# Patient Record
Sex: Male | Born: 1970 | Race: Black or African American | Hispanic: No | Marital: Married | Smoking: Never smoker
Health system: Southern US, Community
[De-identification: ages and names within clinical notes are randomized; demographics above are authoritative.]

## PROBLEM LIST (undated history)

## (undated) DIAGNOSIS — I10 Essential (primary) hypertension: Secondary | ICD-10-CM

## (undated) DIAGNOSIS — R51 Headache: Secondary | ICD-10-CM

## (undated) HISTORY — PX: NO PAST SURGERIES: SHX2092

---

## 2007-02-08 ENCOUNTER — Emergency Department (HOSPITAL_COMMUNITY): Admission: EM | Admit: 2007-02-08 | Discharge: 2007-02-08 | Payer: Self-pay | Admitting: Emergency Medicine

## 2007-02-13 ENCOUNTER — Encounter: Admission: RE | Admit: 2007-02-13 | Discharge: 2007-02-13 | Payer: Self-pay | Admitting: Internal Medicine

## 2008-07-10 ENCOUNTER — Emergency Department (HOSPITAL_COMMUNITY): Admission: EM | Admit: 2008-07-10 | Discharge: 2008-07-10 | Payer: Self-pay | Admitting: Family Medicine

## 2009-05-23 ENCOUNTER — Emergency Department (HOSPITAL_COMMUNITY): Admission: EM | Admit: 2009-05-23 | Discharge: 2009-05-23 | Payer: Self-pay | Admitting: Family Medicine

## 2009-07-20 ENCOUNTER — Emergency Department (HOSPITAL_COMMUNITY): Admission: EM | Admit: 2009-07-20 | Discharge: 2009-07-20 | Payer: Self-pay | Admitting: Family Medicine

## 2011-03-17 LAB — POCT I-STAT CREATININE
Creatinine, Ser: 1.4
Operator id: 192351

## 2011-03-17 LAB — I-STAT 8, (EC8 V) (CONVERTED LAB)
Chloride: 105
Glucose, Bld: 130 — ABNORMAL HIGH
Potassium: 4.3
pH, Ven: 7.663

## 2011-03-17 LAB — POCT CARDIAC MARKERS: Operator id: 192351

## 2013-06-16 ENCOUNTER — Inpatient Hospital Stay (HOSPITAL_COMMUNITY): Payer: BC Managed Care – PPO

## 2013-06-16 ENCOUNTER — Encounter (HOSPITAL_COMMUNITY): Payer: Self-pay | Admitting: Family Medicine

## 2013-06-16 ENCOUNTER — Emergency Department (HOSPITAL_COMMUNITY): Payer: BC Managed Care – PPO

## 2013-06-16 ENCOUNTER — Observation Stay (HOSPITAL_COMMUNITY)
Admission: EM | Admit: 2013-06-16 | Discharge: 2013-06-17 | DRG: 313 | Disposition: A | Discharge status: Home / Self Care | Payer: BC Managed Care – PPO | Attending: Internal Medicine | Admitting: Internal Medicine

## 2013-06-16 DIAGNOSIS — G43109 Migraine with aura, not intractable, without status migrainosus: Secondary | ICD-10-CM | POA: Diagnosis present

## 2013-06-16 DIAGNOSIS — I10 Essential (primary) hypertension: Secondary | ICD-10-CM

## 2013-06-16 DIAGNOSIS — R209 Unspecified disturbances of skin sensation: Secondary | ICD-10-CM

## 2013-06-16 DIAGNOSIS — R51 Headache: Secondary | ICD-10-CM

## 2013-06-16 DIAGNOSIS — Z91199 Patient's noncompliance with other medical treatment and regimen due to unspecified reason: Secondary | ICD-10-CM

## 2013-06-16 DIAGNOSIS — E669 Obesity, unspecified: Secondary | ICD-10-CM

## 2013-06-16 DIAGNOSIS — Z6829 Body mass index (BMI) 29.0-29.9, adult: Secondary | ICD-10-CM

## 2013-06-16 DIAGNOSIS — R202 Paresthesia of skin: Secondary | ICD-10-CM

## 2013-06-16 DIAGNOSIS — R079 Chest pain, unspecified: Principal | ICD-10-CM | POA: Diagnosis present

## 2013-06-16 DIAGNOSIS — Z9119 Patient's noncompliance with other medical treatment and regimen: Secondary | ICD-10-CM

## 2013-06-16 DIAGNOSIS — R519 Headache, unspecified: Secondary | ICD-10-CM

## 2013-06-16 DIAGNOSIS — F410 Panic disorder [episodic paroxysmal anxiety] without agoraphobia: Secondary | ICD-10-CM

## 2013-06-16 HISTORY — DX: Headache: R51

## 2013-06-16 HISTORY — DX: Essential (primary) hypertension: I10

## 2013-06-16 LAB — URINALYSIS, ROUTINE W REFLEX MICROSCOPIC
Bilirubin Urine: NEGATIVE
GLUCOSE, UA: NEGATIVE mg/dL
HGB URINE DIPSTICK: NEGATIVE
Ketones, ur: 15 mg/dL — AB
LEUKOCYTES UA: NEGATIVE
Nitrite: NEGATIVE
PROTEIN: 30 mg/dL — AB
SPECIFIC GRAVITY, URINE: 1.019 (ref 1.005–1.030)
UROBILINOGEN UA: 0.2 mg/dL (ref 0.0–1.0)
pH: 6 (ref 5.0–8.0)

## 2013-06-16 LAB — CBC
HEMATOCRIT: 55.6 % — AB (ref 39.0–52.0)
Hemoglobin: 19.2 g/dL — ABNORMAL HIGH (ref 13.0–17.0)
MCH: 30 pg (ref 26.0–34.0)
MCHC: 34.5 g/dL (ref 30.0–36.0)
MCV: 86.7 fL (ref 78.0–100.0)
PLATELETS: 243 10*3/uL (ref 150–400)
RBC: 6.41 MIL/uL — ABNORMAL HIGH (ref 4.22–5.81)
RDW: 14.3 % (ref 11.5–15.5)
WBC: 9.2 10*3/uL (ref 4.0–10.5)

## 2013-06-16 LAB — COMPREHENSIVE METABOLIC PANEL
ALBUMIN: 4.6 g/dL (ref 3.5–5.2)
ALT: 26 U/L (ref 0–53)
AST: 24 U/L (ref 0–37)
Alkaline Phosphatase: 60 U/L (ref 39–117)
BILIRUBIN TOTAL: 0.6 mg/dL (ref 0.3–1.2)
BUN: 10 mg/dL (ref 6–23)
CALCIUM: 9.9 mg/dL (ref 8.4–10.5)
CO2: 21 mEq/L (ref 19–32)
CREATININE: 1.33 mg/dL (ref 0.50–1.35)
Chloride: 94 mEq/L — ABNORMAL LOW (ref 96–112)
GFR calc Af Amer: 75 mL/min — ABNORMAL LOW (ref >90)
GFR calc non Af Amer: 65 mL/min — ABNORMAL LOW (ref >90)
Glucose, Bld: 173 mg/dL — ABNORMAL HIGH (ref 70–99)
Potassium: 3.4 mEq/L — ABNORMAL LOW (ref 3.7–5.3)
Sodium: 138 mEq/L (ref 137–147)
Total Protein: 8.6 g/dL — ABNORMAL HIGH (ref 6.0–8.3)

## 2013-06-16 LAB — RAPID URINE DRUG SCREEN, HOSP PERFORMED
Amphetamines: NOT DETECTED
Barbiturates: NOT DETECTED
Benzodiazepines: NOT DETECTED
COCAINE: NOT DETECTED
OPIATES: POSITIVE — AB
TETRAHYDROCANNABINOL: NOT DETECTED

## 2013-06-16 LAB — GLUCOSE, CAPILLARY: Glucose-Capillary: 124 mg/dL — ABNORMAL HIGH (ref 70–99)

## 2013-06-16 LAB — URINE MICROSCOPIC-ADD ON

## 2013-06-16 LAB — TROPONIN I: Troponin I: <0.3 ng/mL (ref <0.30)

## 2013-06-16 LAB — POCT I-STAT TROPONIN I: TROPONIN I, POC: 0.01 ng/mL (ref 0.00–0.08)

## 2013-06-16 LAB — D-DIMER, QUANTITATIVE: D-Dimer, Quant: 0.3 ug/mL-FEU (ref 0.00–0.48)

## 2013-06-16 MED ORDER — ASPIRIN 325 MG PO TABS
325.0000 mg | ORAL_TABLET | Freq: Every day | ORAL | Status: DC
  Administered 2013-06-17: 325 mg via ORAL
  Filled 2013-06-16 (×2): qty 1

## 2013-06-16 MED ORDER — NITROGLYCERIN 0.4 MG SL SUBL
0.4000 mg | SUBLINGUAL_TABLET | SUBLINGUAL | Status: DC | PRN
Start: 2013-06-16 — End: 2013-06-16

## 2013-06-16 MED ORDER — SODIUM CHLORIDE 0.9 % IV BOLUS (SEPSIS)
1000.0000 mL | Freq: Once | INTRAVENOUS | Status: AC
  Administered 2013-06-16: 1000 mL via INTRAVENOUS

## 2013-06-16 MED ORDER — ASPIRIN 81 MG PO CHEW: 324.0000 mg | CHEWABLE_TABLET | Freq: Once | ORAL | Status: DC

## 2013-06-16 MED ORDER — MORPHINE SULFATE 4 MG/ML IJ SOLN
4.0000 mg | Freq: Once | INTRAMUSCULAR | Status: AC
  Administered 2013-06-16: 4 mg via INTRAVENOUS
  Filled 2013-06-16: qty 1

## 2013-06-16 MED ORDER — NITROGLYCERIN 0.4 MG SL SUBL
0.4000 mg | SUBLINGUAL_TABLET | SUBLINGUAL | Status: DC | PRN
  Administered 2013-06-16: 0.4 mg via SUBLINGUAL

## 2013-06-16 MED ORDER — DIPHENHYDRAMINE HCL 50 MG/ML IJ SOLN
12.5000 mg | Freq: Once | INTRAMUSCULAR | Status: AC
  Administered 2013-06-16: 12.5 mg via INTRAVENOUS
  Filled 2013-06-16: qty 1

## 2013-06-16 MED ORDER — ASPIRIN 325 MG PO TABS
325.0000 mg | ORAL_TABLET | ORAL | Status: AC
  Administered 2013-06-16: 325 mg via ORAL
  Filled 2013-06-16: qty 1

## 2013-06-16 MED ORDER — ACETAMINOPHEN 325 MG PO TABS
650.0000 mg | ORAL_TABLET | Freq: Once | ORAL | Status: AC
  Administered 2013-06-16: 650 mg via ORAL
  Filled 2013-06-16: qty 2

## 2013-06-16 MED ORDER — HEPARIN SODIUM (PORCINE) 5000 UNIT/ML IJ SOLN
5000.0000 [IU] | Freq: Three times a day (TID) | INTRAMUSCULAR | Status: DC
  Administered 2013-06-16 – 2013-06-17 (×2): 5000 [IU] via SUBCUTANEOUS
  Filled 2013-06-16 (×5): qty 1

## 2013-06-16 MED ORDER — KETOROLAC TROMETHAMINE 30 MG/ML IJ SOLN
30.0000 mg | Freq: Once | INTRAMUSCULAR | Status: AC
  Administered 2013-06-16: 30 mg via INTRAVENOUS
  Filled 2013-06-16: qty 1

## 2013-06-16 MED ORDER — MORPHINE SULFATE 4 MG/ML IJ SOLN
4.0000 mg | Freq: Once | INTRAMUSCULAR | Status: DC
Start: 2013-06-16 — End: 2013-06-17
  Filled 2013-06-16: qty 1

## 2013-06-16 MED ORDER — ISOMETHEPTENE-APAP-DICHLORAL 65-325-100 MG PO CAPS
2.0000 | ORAL_CAPSULE | Freq: Once | ORAL | Status: AC
  Administered 2013-06-16: 2 via ORAL
  Filled 2013-06-16: qty 2

## 2013-06-16 MED ORDER — PROCHLORPERAZINE EDISYLATE 5 MG/ML IJ SOLN
10.0000 mg | Freq: Four times a day (QID) | INTRAMUSCULAR | Status: DC | PRN
  Administered 2013-06-16: 10 mg via INTRAVENOUS
  Filled 2013-06-16: qty 2

## 2013-06-16 NOTE — H&P (Signed)
[facility] History and Physical  Trenden Hazelrigg ZOX:[id number] DOB: [birth year] DOA: 06/16/2013  Referring physician: Ward PCP: Katy Apo, MD  Specialists: Stroke service  Chief Complaint: multiple, chest pain, headache, vomiting  HPI: James Hernandez is a 43 y.o. male came to [facility] ed 06/16/2013 with  Onset this morning 70 in headache. He has been having headache for the past 5-6 days. It was initially mild but then became more acute this morning 10/10. He was driving over to his primary care physician's office Dr. Nehemiah Settle for his regular physical for the year and he felt at that point like his chest was tightening up, he had an episode of vomiting. He states that his body locked up and he can feel the lower half of his body or his right side or his right leg. He couldn't really tell anything differently but did not have any weakness on any one side but he did not have any loss of consciousness. He has no history of specific medical illnesses other than hypertension but he is not compliant on his home medications. He states that his headache is behind his eyes and radiates down to the back of his neck. Lites subjectively seems to make it worse in the ED - he states his headaches are better. He then complained of some chest pain which lasted about 5-10 minutes and sort of went away. He came to the emergency room and was given IV morphine, nitroglycerin which paradoxically made his headache worse and he had an EKG for his chest pain The EKG showed normal sinus rhythm PR interval 0.12 QRS axis 80 nonspecific ST segment elevation in V2 only with PVC inversions in V4 through 6, unclear chronicity.  Point-of-care troponin was done which was 0.01  rest of labs were negative  CT scan head performed =normal noncontrast CT brain   Review of Systems: The patient denies  Recent fever chills nausea vomiting weakness on any one side body or however now he has numbness on the right side of the face and  right side of the body No dark stool no tarry stool no other issues  No past medical history on file. No past surgical history on file. Social History:  History   Social History Narrative  . No narrative on file    No Known Allergies  No family history on file.  Noncontributory, father had hypertension Mother has no illnesses  Prior to Admission medications   Not on File   Physical Exam: Filed Vitals:   06/16/13 1424 06/16/13 1441 06/16/13 1530 06/16/13 1537  BP: 152/98 140/99 143/85 143/85  Pulse: 99 70 65 67  Temp: 99 F (37.2 C)   98.8 F (37.1 C)  TempSrc: Oral   Oral  Resp: 26 22 13 18   SpO2: 100% 98% 98% 96%     General:  EOMI, NCAT  Eyes: see above  ENT:  No JVD no bruit  Neck:  Soft supple  Cardiovascular:  S1-S2 no murmur rub or gallop  Respiratory:  Clinically clear  Abdomen:  Soft nontender nondistended no rebound or guarding  Skin:  No lower extremity edema  Musculoskeletal:  Range of motion intact  Psychiatric:  Euthymic   Neurologic:  of  Labs on Admission:  Basic Metabolic Panel:  Recent Labs Lab 06/16/13 1439  NA 138  K 3.4*  CL 94*  CO2 21  GLUCOSE 173*  BUN 10  CREATININE 1.33  CALCIUM 9.9   Liver Function Tests:  Recent Labs Lab 06/16/13 1439  AST 24  ALT 26  ALKPHOS 60  BILITOT 0.6  PROT 8.6*  ALBUMIN 4.6   No results found for this basename: LIPASE, AMYLASE,  in the last 168 hours No results found for this basename: AMMONIA,  in the last 168 hours CBC:  Recent Labs Lab 06/16/13 1431  WBC 9.2  HGB 19.2*  HCT 55.6*  MCV 86.7  PLT 243   Cardiac Enzymes:  Recent Labs Lab 06/16/13 1614  TROPONINI <0.30    BNP (last 3 results) No results found for this basename: PROBNP,  in the last 8760 hours CBG: No results found for this basename: GLUCAP,  in the last 168 hours  Radiological Exams on Admission: Dg Chest 2 View  06/16/2013   CLINICAL DATA:  Chest pain  EXAM: CHEST  2 VIEW  COMPARISON:   February 08, 2007  FINDINGS: There is an opacity either within or overlying the anterior left 1st rib measuring 1.1 x 1.4 cm. Elsewhere lungs are clear. Heart size and pulmonary vascularity are normal. No adenopathy.  IMPRESSION: Opacity either in or overlying the anterior left 1st rib. Given this circumstance, the would advise apical lordotic chest radiograph to further evaluate. If this area of opacity cannot be confirmed to reside within the anterior left 1st rib, correlation with noncontrast enhanced chest CT would be advised to further assess. Elsewhere lungs are clear.   Electronically Signed   By: Bretta Bang M.D.   On: 06/16/2013 15:09   Ct Head Wo Contrast  06/16/2013   CLINICAL DATA:  43 year old male with headache, chest pain, disoriented, difficulty speaking. Initial encounter.  EXAM: CT HEAD WITHOUT CONTRAST  TECHNIQUE: Contiguous axial images were obtained from the base of the skull through the vertex without intravenous contrast.  COMPARISON:  None.  FINDINGS: Minimal paranasal sinus mucosal thickening. Mastoids and tympanic cavities are clear. No acute osseous abnormality identified. Scattered mostly punctate scalp soft tissue calcifications, might in part be sequelae of remote trauma. Dysconjugate gaze, otherwise negative orbits soft tissues.  Cerebral volume is normal. No midline shift, ventriculomegaly, mass effect, evidence of mass lesion, intracranial hemorrhage or evidence of cortically based acute infarction. Gray-white matter differentiation is within normal limits throughout the brain. No suspicious intracranial vascular hyperdensity.  IMPRESSION: Normal noncontrast CT appearance of the brain.   Electronically Signed   By: Augusto Gamble M.D.   On: 06/16/2013 15:15    EKG: Independently reviewed. See Above  Assessment/Plan Principal Problem:   Chest pain- etiology, although EKG showed some T-wave inversions in some leads, point-of-care troponin is negative. He will need to  probably  Be risk stratified and I have spoken to Dr. Clifton James of cardiology who recommends to rule out acute coronary syndrome by troponins repeat EKG in the morning. If needed, please consult cardiology formally. Active Problems:   CVA (cerebral infarction)- Discussed with Dr. Roseanne Reno of neurology his impression. DDX = hemiplegic migraine, panic disorder causing both symptoms. He states that his chest pain felt like a sour feeling in his chest radiating up and down and then he also states in the same breath that he has pain radiating from the chest down his back down to his legs. This does not need any syndrome or sign and Dr. Roseanne Reno recommends MRI of the brain to definitively and objectively rule out an infarct. He will see the patient in consult.   4 atypical migraine treatment I have started him on Midrin. We will add Zanaflex or Flexeril as needed.   Hypertension- Monitor-  until  MRI is done, allow permissive hypertension   Panic anxiety syndrome-   Obesity- see above,  Outpatient followup   Time spent:  [address]  [address], [facility]AI[facility]URMUKH [facility] Pager [phone]  If 7PM-7AM, please contact night-coverage [email or web address] Password [email or web address] 06/16/2013, 4:59 PM

## 2013-06-16 NOTE — ED Notes (Signed)
Pt arrives via private vehicle from home. Needed staff assistance getting out of car. Pt nauseated, vomiting, near syncope with large amount of diaphoresis. States chest pain beginning at 0700. Pt states no cardiac hx. Unable to obtain full hx. Pt transferred to ready available room via wheelchair with staff.

## 2013-06-16 NOTE — ED Notes (Signed)
hospitalist at bedside

## 2013-06-16 NOTE — ED Notes (Signed)
Pt states cp has gone but headache is still there.

## 2013-06-16 NOTE — Consult Note (Signed)
Neurology Consultation Reason for Consult: tingling  Referring Physician: Landis Gandy  CC: Headache  History is obtained from: Patient  HPI: James Hernandez is a 43 y.o. male with a history of headaches worsening over the past few months who awoke this morning with a throbbing headache. He states that is retro-orbital, bilateral but worse on the right. Over the course of the morning he began noticing some paresthesias of his face bilaterally. Later in the morning, he had acute onset of chest pain that caused him to double over. In this. He fell he was able to do anything because of the pain. This resolved, however he was left with persistent numbness right greater than left as well as tingling. The tingling has slowly retracted marching up his leg and is now just confined to his right temple.   LKW: 1/11 prior to bedtime tpa given?: no, outside of window    ROS: A 14 point ROS was performed and is negative except as noted in the HPI.  PMH: Headaches  Family History: Mother-trigeminal neuralgia  Social History: Tob: Denies  Exam: Current vital signs: BP 125/76  Pulse 79  Temp(Src) 98.2 F (36.8 C) (Oral)  Resp 18  Ht 5\' 11"  (1.803 m)  Wt 97.387 kg (214 lb 11.2 oz)  BMI 29.96 kg/m2  SpO2 97% Vital signs in last 24 hours: Temp:  [98.2 F (36.8 C)-99 F (37.2 C)] 98.2 F (36.8 C) (01/12 1928) Pulse Rate:  [65-99] 79 (01/12 1928) Resp:  [13-26] 18 (01/12 1928) BP: (125-152)/(71-99) 125/76 mmHg (01/12 1928) SpO2:  [96 %-100 %] 97 % (01/12 1928) Weight:  [97.387 kg (214 lb 11.2 oz)] 97.387 kg (214 lb 11.2 oz) (01/12 1928)  General: In bed, NAD CV: Regular rate and rhythm Mental Status: Patient is awake, alert, oriented to person, place, month, year, and situation. Immediate and remote memory are intact. Patient is able to give a clear and coherent history. No signs of aphasia or neglect Cranial Nerves: II: Visual Fields are full. Pupils are equal, round, and reactive to  light.  Discs are difficult to visualize. III,IV, VI: EOMI without ptosis or diploplia.  V: Facial sensation is symmetric to temperature VII: Facial movement is symmetric.  VIII: hearing is intact to voice X: Uvula elevates symmetrically XI: Shoulder shrug is symmetric. XII: tongue is midline without atrophy or fasciculations.  Motor: Tone is normal. Bulk is normal. 5/5 strength was present in all four extremities.  Sensory: Sensation is symmetric to light touch and temperature in the arms and legs. Deep Tendon Reflexes: 2+ and symmetric in the biceps and patellae.  Plantars: Toes are downgoing bilaterally.  Cerebellar: FNF and HKS are intact bilaterally Gait: Patient has a stable casual gait.          I have reviewed labs in epic and the results pertinent to this consultation are: CMP-elevated glucose  I have reviewed the images obtained: CT head-negative  Impression: 43 year old male with throbbing headache associated with photophobia and paresthesias, bilateral but right more than left. I suspect that this represents complicated migraine. This is not typically associated with chest pain, and will defer to internal medicine for the evaluation of this symptom.  He will need an MRI to rule out a small stroke, but my index of suspicion for this is low  Recommendations: 1) MRI brain-if positive will need stroke Workup 2) Compazine/Benadryl/Toradol for likely complicated migraine 3) ASA reasonable for now, if no stroke on MRI then my suspicion for vascular event involving the  CNS is low, would defer to internal medicine regarding need for cardiac reasons. 4) will continue to follow    Ritta SlotMcNeill Zolton Dowson, MD [facility] [phone]  If 7pm- 7am, please page neurology on call at [phone].

## 2013-06-16 NOTE — ED Provider Notes (Signed)
TIME SEEN: 2:50 PM  CHIEF COMPLAINT: Headache, chest pain, diaphoresis, vomiting  HPI: Patient is a 43 year old male with history of chronic headaches, hypertension, prior tobacco use who presents the emergency department with diffuse chest tightness that started at 1 PM today while at rest with associated shortness of breath, nausea and vomiting, diaphoresis. Patient reports that he also had a gradual onset, diffuse, throbbing headache that started this morning. He's had similar headaches in the past. He states he feels very weak all over but no focal weakness or numbness. No head injury, anticoagulation use, thunderclap headache. No fever, neck pain or neck stiffness. Denies a prior history of cardiac disease, stress test or stents.  ROS: See HPI Constitutional: no fever  Eyes: no drainage  ENT: no runny nose   Cardiovascular:  chest pain  Resp:  SOB  GI: no vomiting GU: no dysuria Integumentary: no rash  Allergy: no hives  Musculoskeletal: no leg swelling  Neurological: no slurred speech ROS otherwise negative  PAST MEDICAL HISTORY/PAST SURGICAL HISTORY:  No past medical history on file.  MEDICATIONS:  Prior to Admission medications   Not on File    ALLERGIES:  No Known Allergies  SOCIAL HISTORY:  History  Substance Use Topics  . Smoking status: Not on file  . Smokeless tobacco: Not on file  . Alcohol Use: Not on file    FAMILY HISTORY: No family history on file.  EXAM: BP 140/99  Pulse 70  Temp(Src) 99 F (37.2 C) (Oral)  Resp 22  SpO2 98% CONSTITUTIONAL: Alert and oriented and responds appropriately to questions. Well-appearing; well-nourished HEAD: Normocephalic EYES: Conjunctivae clear, PERRL; patient will not open his eyes when I am speaking to him that he states he can't, when he does open his eyes he rolls his eyes in the back of his head and states "I cannot see" about when I open his eyelids his pupils are equal reactive bilaterally and he reports normal  vision ENT: normal nose; no rhinorrhea; moist mucous membranes; pharynx without lesions noted NECK: Supple, no meningismus, no LAD  CARD: RRR; S1 and S2 appreciated; no murmurs, no clicks, no rubs, no gallops RESP: Normal chest excursion without splinting or tachypnea; breath sounds clear and equal bilaterally; no wheezes, no rhonchi, no rales,  ABD/GI: Normal bowel sounds; non-distended; soft, non-tender, no rebound, no guarding BACK:  The back appears normal and is non-tender to palpation, there is no CVA tenderness EXT: Normal ROM in all joints; non-tender to palpation; no edema; normal capillary refill; no cyanosis    SKIN: Normal color for age and race; warm; extremely diaphoretic NEURO: Moves all extremities equally but very minimally, he states he feels "too weak to move"; cranial nerves II through XII intact, sensation to light touch intact diffusely PSYCH: The patient's mood and manner are appropriate. Grooming and personal hygiene are appropriate.  MEDICAL DECISION MAKING: Patient here with gradual onset, diffuse headache that is similar to his prior chronic headaches and chest pain, shortness of breath, nausea vomiting, diaphoresis. He is minimal risk factors for ACS but appears very uncomfortable and diaphoretic on exam. He is hemodynamically stable. EKG shows T-wave inversion in inferior and lateral leads that is similar compared to prior EKG. There is no new ischemic changes. He has no focal neurologic deficit on his exam. Will obtain head CT, labs including troponin and d-dimer, chest x-ray. We'll give morphine for pain.  ED PROGRESS: Patient reports he is feeling better but his chest is completely resolved after morphine. His  labs are relatively unremarkable the patient has mild hypokalemia, will replace. Troponin negative. Chest x-ray shows a possible opacity over the anterior left first rib. Radiology recommended a noncontrast chest CT which I feel can be done as an outpatient. His  head CT is negative. D-dimer negative. We'll give nitroglycerin for his chest pain. He is now no longer diaphoretic and is moving all his extremities normally and opening his eyes. Will discuss with hospitalist for admission for ACS rule out. His primary care physician is Dr. Nehemiah Settle with [facility] physicians.   4:02 PM  Spoke with Dr. Mahala Menghini with hospitalist service for admission ACS rule out. Patient is hemodynamically stable. Repeat EKG shows T-wave inversions in inferior and lateral leads that have been present in prior EKGs. Will repeat troponin per hospitalist request.  Patient's chest pain is almost completely resolved after morphine and nitroglycerin. He is now complaining of worsening headache. Will repeat morphine and give IV fluids.     EKG Interpretation    Date/Time:  Monday June 16 2013 14:22:30 EST Ventricular Rate:  103 PR Interval:  152 QRS Duration: 90 QT Interval:  334 QTC Calculation: 437 R Axis:   99 Text Interpretation:  Sinus tachycardia with Possible Premature atrial complexes with Abberant conduction Biatrial enlargement Left ventricular hypertrophy Lateral infarct , age undetermined T wave abnormality, consider inferior ischemia Abnormal ECG No significant change since last tracing Confirmed by Nayelli Inglis  DO, Totiana Everson [phone]) on 06/16/2013 2:29:43 PM           EKG Interpretation    Date/Time:  Monday June 16 2013 15:55:17 EST Ventricular Rate:  66 PR Interval:  152 QRS Duration: 82 QT Interval:  403 QTC Calculation: 422 R Axis:   82 Text Interpretation:  Age not entered, assumed to be  43 years old for purpose of ECG interpretation Sinus rhythm Nonspecific T abnrm, anterolateral leads No significant change since 02/08/2007 Confirmed by Cymone Yeske  DO, Solaris Kram ([id number]) on 06/16/2013 4:04:59 PM             Layla Maw Duncan Alejandro, DO 06/16/13 1605

## 2013-06-16 NOTE — ED Notes (Signed)
Per pt's son pt was in the car on the way to a dr appt when he began to breath heavily, vomit, and complained of chest pain and told son to drive him here. Pt was diaphoretic, pale, and c/o cp and rt sided leg pain. Pt rated pain 8/10. Pt needed assistance from wheelchair to bed.

## 2013-06-16 NOTE — ED Notes (Signed)
Pt sts had a 10/10 headache this morning and persisted, behind temple and radiated to back of neck. Pt son was driving to PCP had chest pains and 1 episode of vomiting. Asked son to drive to to ED instead. Pt sts pain is now 4/10 behind right eye. And unratable- sts "mild" discomfort in chest.

## 2013-06-17 LAB — LIPID PANEL
CHOL/HDL RATIO: 4.8 ratio
CHOLESTEROL: 218 mg/dL — AB (ref 0–200)
HDL: 45 mg/dL (ref >39)
LDL Cholesterol: 154 mg/dL — ABNORMAL HIGH (ref 0–99)
TRIGLYCERIDES: 95 mg/dL (ref <150)
VLDL: 19 mg/dL (ref 0–40)

## 2013-06-17 LAB — GLUCOSE, CAPILLARY
GLUCOSE-CAPILLARY: 130 mg/dL — AB (ref 70–99)
GLUCOSE-CAPILLARY: 97 mg/dL (ref 70–99)

## 2013-06-17 MED ORDER — LISINOPRIL-HYDROCHLOROTHIAZIDE 10-12.5 MG PO TABS: 1.0000 | ORAL_TABLET | Freq: Every day | ORAL | Status: DC

## 2013-06-17 NOTE — Progress Notes (Addendum)
Subjective: Currently patient asymptomatic except for a mild headache. Admission H&P reviewed all studies reviewed. Patient presented with a myriad symptoms. He's been seen in consultation by neurology,MRI without any acute CVA. Cardiology was curb sided, troponin normal, d-dimer normal, EKG without acute changes. Currently fairly asymptomatic.  Objective: Vital signs in last 24 hours: Temp:  [97.6 F (36.4 C)-99 F (37.2 C)] 98 F (36.7 C) (01/13 0800) Pulse Rate:  [65-99] 66 (01/13 0800) Resp:  [13-26] 18 (01/13 0800) BP: (125-152)/(67-99) 144/67 mmHg (01/13 0800) SpO2:  [96 %-100 %] 98 % (01/13 0800) Weight:  [97.387 kg (214 lb 11.2 oz)] 97.387 kg (214 lb 11.2 oz) (01/12 1928) Weight change:  Last BM Date: 06/16/13  Intake/Output from previous day:   Intake/Output this shift:    General appearance: alert and cooperative Resp: clear to auscultation bilaterally Cardio: regular rate and rhythm, S1, S2 normal, no murmur, click, rub or gallop Extremities: extremities normal, atraumatic, no cyanosis or edema Neurologic: Grossly normal  Lab Results:  Results for orders placed during the hospital encounter of 06/16/13 (from the past 24 hour(s))  D-DIMER, QUANTITATIVE     Status: None   Collection Time    06/16/13  2:00 PM      Result Value Range   D-Dimer, Quant 0.30  0.00 - 0.48 ug/mL-FEU  CBC     Status: Abnormal   Collection Time    06/16/13  2:31 PM      Result Value Range   WBC 9.2  4.0 - 10.5 K/uL   RBC 6.41 (*) 4.22 - 5.81 MIL/uL   Hemoglobin 19.2 (*) 13.0 - 17.0 g/dL   HCT 55.6 (*) 39.0 - 52.0 %   MCV 86.7  78.0 - 100.0 fL   MCH 30.0  26.0 - 34.0 pg   MCHC 34.5  30.0 - 36.0 g/dL   RDW 14.3  11.5 - 15.5 %   Platelets 243  150 - 400 K/uL  COMPREHENSIVE METABOLIC PANEL     Status: Abnormal   Collection Time    06/16/13  2:39 PM      Result Value Range   Sodium 138  137 - 147 mEq/L   Potassium 3.4 (*) 3.7 - 5.3 mEq/L   Chloride 94 (*) 96 - 112 mEq/L   CO2 21  19  - 32 mEq/L   Glucose, Bld 173 (*) 70 - 99 mg/dL   BUN 10  6 - 23 mg/dL   Creatinine, Ser 1.33  0.50 - 1.35 mg/dL   Calcium 9.9  8.4 - 10.5 mg/dL   Total Protein 8.6 (*) 6.0 - 8.3 g/dL   Albumin 4.6  3.5 - 5.2 g/dL   AST 24  0 - 37 U/L   ALT 26  0 - 53 U/L   Alkaline Phosphatase 60  39 - 117 U/L   Total Bilirubin 0.6  0.3 - 1.2 mg/dL   GFR calc non Af Amer 65 (*) >90 mL/min   GFR calc Af Amer 75 (*) >90 mL/min  POCT I-STAT TROPONIN I     Status: None   Collection Time    06/16/13  2:50 PM      Result Value Range   Troponin i, poc 0.01  0.00 - 0.08 ng/mL   Comment 3           TROPONIN I     Status: None   Collection Time    06/16/13  4:14 PM      Result Value Range  Troponin I <0.30  <0.30 ng/mL  URINALYSIS, ROUTINE W REFLEX MICROSCOPIC     Status: Abnormal   Collection Time    06/16/13  6:21 PM      Result Value Range   Color, Urine YELLOW  YELLOW   APPearance CLEAR  CLEAR   Specific Gravity, Urine 1.019  1.005 - 1.030   pH 6.0  5.0 - 8.0   Glucose, UA NEGATIVE  NEGATIVE mg/dL   Hgb urine dipstick NEGATIVE  NEGATIVE   Bilirubin Urine NEGATIVE  NEGATIVE   Ketones, ur 15 (*) NEGATIVE mg/dL   Protein, ur 30 (*) NEGATIVE mg/dL   Urobilinogen, UA 0.2  0.0 - 1.0 mg/dL   Nitrite NEGATIVE  NEGATIVE   Leukocytes, UA NEGATIVE  NEGATIVE  URINE RAPID DRUG SCREEN (HOSP PERFORMED)     Status: Abnormal   Collection Time    06/16/13  6:21 PM      Result Value Range   Opiates POSITIVE (*) NONE DETECTED   Cocaine NONE DETECTED  NONE DETECTED   Benzodiazepines NONE DETECTED  NONE DETECTED   Amphetamines NONE DETECTED  NONE DETECTED   Tetrahydrocannabinol NONE DETECTED  NONE DETECTED   Barbiturates NONE DETECTED  NONE DETECTED  URINE MICROSCOPIC-ADD ON     Status: None   Collection Time    06/16/13  6:21 PM      Result Value Range   Squamous Epithelial / LPF RARE  RARE   WBC, UA 0-2  <3 WBC/hpf   RBC / HPF 0-2  <3 RBC/hpf   Bacteria, UA RARE  RARE   Urine-Other MUCOUS PRESENT     GLUCOSE, CAPILLARY     Status: Abnormal   Collection Time    06/16/13  9:53 PM      Result Value Range   Glucose-Capillary 124 (*) 70 - 99 mg/dL  LIPID PANEL     Status: Abnormal   Collection Time    06/17/13  3:30 AM      Result Value Range   Cholesterol 218 (*) 0 - 200 mg/dL   Triglycerides 95  <150 mg/dL   HDL 45  >39 mg/dL   Total CHOL/HDL Ratio 4.8     VLDL 19  0 - 40 mg/dL   LDL Cholesterol 154 (*) 0 - 99 mg/dL  GLUCOSE, CAPILLARY     Status: Abnormal   Collection Time    06/17/13  8:33 AM      Result Value Range   Glucose-Capillary 130 (*) 70 - 99 mg/dL   Comment 1 Documented in Chart     Comment 2 Notify RN        Studies/Results: Dg Chest 2 View  06/16/2013   CLINICAL DATA:  Chest pain  EXAM: CHEST  2 VIEW  COMPARISON:  February 08, 2007  FINDINGS: There is an opacity either within or overlying the anterior left 1st rib measuring 1.1 x 1.4 cm. Elsewhere lungs are clear. Heart size and pulmonary vascularity are normal. No adenopathy.  IMPRESSION: Opacity either in or overlying the anterior left 1st rib. Given this circumstance, the would advise apical lordotic chest radiograph to further evaluate. If this area of opacity cannot be confirmed to reside within the anterior left 1st rib, correlation with noncontrast enhanced chest CT would be advised to further assess. Elsewhere lungs are clear.   Electronically Signed   By: Lowella Grip M.D.   On: 06/16/2013 15:09   Ct Head Wo Contrast  06/16/2013   CLINICAL DATA:  43 year old male  with headache, chest pain, disoriented, difficulty speaking. Initial encounter.  EXAM: CT HEAD WITHOUT CONTRAST  TECHNIQUE: Contiguous axial images were obtained from the base of the skull through the vertex without intravenous contrast.  COMPARISON:  None.  FINDINGS: Minimal paranasal sinus mucosal thickening. Mastoids and tympanic cavities are clear. No acute osseous abnormality identified. Scattered mostly punctate scalp soft tissue  calcifications, might in part be sequelae of remote trauma. Dysconjugate gaze, otherwise negative orbits soft tissues.  Cerebral volume is normal. No midline shift, ventriculomegaly, mass effect, evidence of mass lesion, intracranial hemorrhage or evidence of cortically based acute infarction. Gray-white matter differentiation is within normal limits throughout the brain. No suspicious intracranial vascular hyperdensity.  IMPRESSION: Normal noncontrast CT appearance of the brain.   Electronically Signed   By: Lars Pinks M.D.   On: 06/16/2013 15:15   Mri Brain Without Contrast  06/17/2013   CLINICAL DATA:  Chronic headaches, hypertension. Generalized weakness.  EXAM: MRI HEAD WITHOUT CONTRAST  MRA HEAD WITHOUT CONTRAST  TECHNIQUE: Multiplanar, multiecho pulse sequences of the brain and surrounding structures were obtained without intravenous contrast. Angiographic images of the head were obtained using MRA technique without contrast.  COMPARISON:  CT of the head June 16, 2013.  FINDINGS: MRI HEAD FINDINGS  The ventricles and sulci are normal for patient's age. Mild patchy non expansile pontine. T2 hyperintensities. Subcentimeter left periatrial nonspecific white matter lesion. Minimal PC FLAIR hyperintense signal within the deep parietal white matter. Punctate T2 hyperintensities within the basal ganglia most consistent with perivascular spaces. No susceptibility artifact to suggest hemorrhage.  No abnormal extra-axial fluid collections. Ocular globes and orbital contents are unremarkable though not tailored for evaluation. Trace paranasal sinus mucosal thickening without air-fluid levels. Mastoid air cells appear well-aerated. No abnormal sellar expansion. No cerebellar tonsillar ectopia. No suspicious calvarial bone marrow signal. No mass lesions, mass effect. No reduced diffusion to suggest acute ischemia. No susceptibility artifact to suggest hemorrhage.  No abnormal extra-axial fluid collections. No  extra-axial masses though, contrast enhanced sequences would be more sensitive. Normal major intracranial vascular flow voids seen at the skull base.  Ocular globes and orbital contents are unremarkable though not tailored for evaluation. No abnormal sellar expansion. Visualized paranasal sinuses and mastoid air cells are well-aerated. No suspicious calvarial bone marrow signal. No abnormal sellar expansion. Craniocervical junction maintained.  MRA HEAD FINDINGS  Anterior circulation: Normal flow related enhancement of the included cervical, petrous, cavernous and supra clinoid internal carotid arteries. Patent anterior communicating artery. Normal flow related enhancement of the anterior and middle cerebral arteries, including more distal segments.  Posterior circulation: Right vertebral artery is dominant. Basilar artery is patent, with normal flow related enhancement of the main branch vessels. Normal flow related enhancement of the posterior cerebral arteries.  No large vessel occlusion, hemodynamically significant stenosis, aneurysm within the anterior nor posterior circulation. Mild intracranial luminal irregularity may reflect atherosclerosis, with most apparent involvement of the posterior cerebral arteries.  IMPRESSION: MRI of the brain: No acute intracranial process. Mild to moderate white matter changes suggest chronic small vessel ischemic disease, possibly secondary to patient's reported hypertension.  MRA of the head: No hemodynamically significant stenosis. Mild luminal irregularity of the intracranial vessels may reflect atherosclerosis.   Electronically Signed   By: Elon Alas   On: 06/17/2013 05:33   Mr Jodene Nam Head/brain Wo Cm  06/17/2013   CLINICAL DATA:  Chronic headaches, hypertension. Generalized weakness.  EXAM: MRI HEAD WITHOUT CONTRAST  MRA HEAD WITHOUT CONTRAST  TECHNIQUE: Multiplanar, multiecho  pulse sequences of the brain and surrounding structures were obtained without intravenous  contrast. Angiographic images of the head were obtained using MRA technique without contrast.  COMPARISON:  CT of the head June 16, 2013.  FINDINGS: MRI HEAD FINDINGS  The ventricles and sulci are normal for patient's age. Mild patchy non expansile pontine. T2 hyperintensities. Subcentimeter left periatrial nonspecific white matter lesion. Minimal PC FLAIR hyperintense signal within the deep parietal white matter. Punctate T2 hyperintensities within the basal ganglia most consistent with perivascular spaces. No susceptibility artifact to suggest hemorrhage.  No abnormal extra-axial fluid collections. Ocular globes and orbital contents are unremarkable though not tailored for evaluation. Trace paranasal sinus mucosal thickening without air-fluid levels. Mastoid air cells appear well-aerated. No abnormal sellar expansion. No cerebellar tonsillar ectopia. No suspicious calvarial bone marrow signal. No mass lesions, mass effect. No reduced diffusion to suggest acute ischemia. No susceptibility artifact to suggest hemorrhage.  No abnormal extra-axial fluid collections. No extra-axial masses though, contrast enhanced sequences would be more sensitive. Normal major intracranial vascular flow voids seen at the skull base.  Ocular globes and orbital contents are unremarkable though not tailored for evaluation. No abnormal sellar expansion. Visualized paranasal sinuses and mastoid air cells are well-aerated. No suspicious calvarial bone marrow signal. No abnormal sellar expansion. Craniocervical junction maintained.  MRA HEAD FINDINGS  Anterior circulation: Normal flow related enhancement of the included cervical, petrous, cavernous and supra clinoid internal carotid arteries. Patent anterior communicating artery. Normal flow related enhancement of the anterior and middle cerebral arteries, including more distal segments.  Posterior circulation: Right vertebral artery is dominant. Basilar artery is patent, with normal flow  related enhancement of the main branch vessels. Normal flow related enhancement of the posterior cerebral arteries.  No large vessel occlusion, hemodynamically significant stenosis, aneurysm within the anterior nor posterior circulation. Mild intracranial luminal irregularity may reflect atherosclerosis, with most apparent involvement of the posterior cerebral arteries.  IMPRESSION: MRI of the brain: No acute intracranial process. Mild to moderate white matter changes suggest chronic small vessel ischemic disease, possibly secondary to patient's reported hypertension.  MRA of the head: No hemodynamically significant stenosis. Mild luminal irregularity of the intracranial vessels may reflect atherosclerosis.   Electronically Signed   By: Elon Alas   On: 06/17/2013 05:33    Medications:  Prior to Admission:  No prescriptions prior to admission   Scheduled: . aspirin  325 mg Oral Daily  . heparin  5,000 Units Subcutaneous Q8H  .  morphine injection  4 mg Intravenous Once   Continuous:  UEA:[id number], prochlorperazine  Assessment/Plan: Headache, CVA ruled out, neurology input appreciated  Chest pain, normal troponin, EKG without acute change. Patient without any history of exertional symptoms not sure if what was described on admission is consistent with cardiac etiology Hypertension, patient should be on Zestoretic  Overall patient appears to be at his baseline, I cannot completely explain the multiple  symptoms he's had but we do know he did not have a stroke and does not appear to be having an acute coronary event. Disposition pending neurology input   LOS: 1 day   Nylah Butkus D 06/17/2013, 9:27 AM

## 2013-06-17 NOTE — Discharge Summary (Signed)
Physician Discharge Summary  Patient ID: James Hernandez MRN: [id number] DOB/AGE: 43/06/1970 43 y.o.  Admit date: 06/16/2013 Discharge date: 06/17/2013  Admission Diagnoses:  Discharge Diagnoses:  Principal Problem:   Chest pain Active Problems:   Hypertension   Panic anxiety syndrome   Obesity   Headache(784.0)   Paresthesia   Discharged Condition: stable  Hospital Course:  Patient presented to the emergency room for evaluation, he had multiple symptoms including headache paresthesia chest tightness, the sensation that his body was locking up. Patient was admitted, he was seen in consultation by neurology. He any CT of his head MRI without any acute pathology. There was concern he may have had a complicated migraine. Patient had an EKG without acute abnormality, troponin was normal d-dimer normal. Patient is back to his baseline. Patient is felt to be stable for discharge for outpatient followup. Patient should be on blood pressure medicine but has not been taking his medicine. Recommend resume Zestoretic we will have further discussion on outpatient basis  Consults:    Significant Diagnostic Studies:Dg Chest 2 View  06/16/2013   CLINICAL DATA:  Chest pain  EXAM: CHEST  2 VIEW  COMPARISON:  February 08, 2007  FINDINGS: There is an opacity either within or overlying the anterior left 1st rib measuring 1.1 x 1.4 cm. Elsewhere lungs are clear. Heart size and pulmonary vascularity are normal. No adenopathy.  IMPRESSION: Opacity either in or overlying the anterior left 1st rib. Given this circumstance, the would advise apical lordotic chest radiograph to further evaluate. If this area of opacity cannot be confirmed to reside within the anterior left 1st rib, correlation with noncontrast enhanced chest CT would be advised to further assess. Elsewhere lungs are clear.   Electronically Signed   By: Bretta BangWilliam  Woodruff M.D.   On: 06/16/2013 15:09   Ct Head Wo Contrast  06/16/2013   CLINICAL DATA:   43 year old male with headache, chest pain, disoriented, difficulty speaking. Initial encounter.  EXAM: CT HEAD WITHOUT CONTRAST  TECHNIQUE: Contiguous axial images were obtained from the base of the skull through the vertex without intravenous contrast.  COMPARISON:  None.  FINDINGS: Minimal paranasal sinus mucosal thickening. Mastoids and tympanic cavities are clear. No acute osseous abnormality identified. Scattered mostly punctate scalp soft tissue calcifications, might in part be sequelae of remote trauma. Dysconjugate gaze, otherwise negative orbits soft tissues.  Cerebral volume is normal. No midline shift, ventriculomegaly, mass effect, evidence of mass lesion, intracranial hemorrhage or evidence of cortically based acute infarction. Gray-white matter differentiation is within normal limits throughout the brain. No suspicious intracranial vascular hyperdensity.  IMPRESSION: Normal noncontrast CT appearance of the brain.   Electronically Signed   By: Augusto GambleLee  Hall M.D.   On: 06/16/2013 15:15   Mri Brain Without Contrast  06/17/2013   CLINICAL DATA:  Chronic headaches, hypertension. Generalized weakness.  EXAM: MRI HEAD WITHOUT CONTRAST  MRA HEAD WITHOUT CONTRAST  TECHNIQUE: Multiplanar, multiecho pulse sequences of the brain and surrounding structures were obtained without intravenous contrast. Angiographic images of the head were obtained using MRA technique without contrast.  COMPARISON:  CT of the head June 16, 2013.  FINDINGS: MRI HEAD FINDINGS  The ventricles and sulci are normal for patient's age. Mild patchy non expansile pontine. T2 hyperintensities. Subcentimeter left periatrial nonspecific white matter lesion. Minimal PC FLAIR hyperintense signal within the deep parietal white matter. Punctate T2 hyperintensities within the basal ganglia most consistent with perivascular spaces. No susceptibility artifact to suggest hemorrhage.  No abnormal extra-axial fluid collections.  Ocular globes and orbital  contents are unremarkable though not tailored for evaluation. Trace paranasal sinus mucosal thickening without air-fluid levels. Mastoid air cells appear well-aerated. No abnormal sellar expansion. No cerebellar tonsillar ectopia. No suspicious calvarial bone marrow signal. No mass lesions, mass effect. No reduced diffusion to suggest acute ischemia. No susceptibility artifact to suggest hemorrhage.  No abnormal extra-axial fluid collections. No extra-axial masses though, contrast enhanced sequences would be more sensitive. Normal major intracranial vascular flow voids seen at the skull base.  Ocular globes and orbital contents are unremarkable though not tailored for evaluation. No abnormal sellar expansion. Visualized paranasal sinuses and mastoid air cells are well-aerated. No suspicious calvarial bone marrow signal. No abnormal sellar expansion. Craniocervical junction maintained.  MRA HEAD FINDINGS  Anterior circulation: Normal flow related enhancement of the included cervical, petrous, cavernous and supra clinoid internal carotid arteries. Patent anterior communicating artery. Normal flow related enhancement of the anterior and middle cerebral arteries, including more distal segments.  Posterior circulation: Right vertebral artery is dominant. Basilar artery is patent, with normal flow related enhancement of the main branch vessels. Normal flow related enhancement of the posterior cerebral arteries.  No large vessel occlusion, hemodynamically significant stenosis, aneurysm within the anterior nor posterior circulation. Mild intracranial luminal irregularity may reflect atherosclerosis, with most apparent involvement of the posterior cerebral arteries.  IMPRESSION: MRI of the brain: No acute intracranial process. Mild to moderate white matter changes suggest chronic small vessel ischemic disease, possibly secondary to patient's reported hypertension.  MRA of the head: No hemodynamically significant stenosis.  Mild luminal irregularity of the intracranial vessels may reflect atherosclerosis.   Electronically Signed   By: Awilda Metro   On: 06/17/2013 05:33   Mr Maxine Glenn Head/brain Wo Cm  06/17/2013   CLINICAL DATA:  Chronic headaches, hypertension. Generalized weakness.  EXAM: MRI HEAD WITHOUT CONTRAST  MRA HEAD WITHOUT CONTRAST  TECHNIQUE: Multiplanar, multiecho pulse sequences of the brain and surrounding structures were obtained without intravenous contrast. Angiographic images of the head were obtained using MRA technique without contrast.  COMPARISON:  CT of the head June 16, 2013.  FINDINGS: MRI HEAD FINDINGS  The ventricles and sulci are normal for patient's age. Mild patchy non expansile pontine. T2 hyperintensities. Subcentimeter left periatrial nonspecific white matter lesion. Minimal PC FLAIR hyperintense signal within the deep parietal white matter. Punctate T2 hyperintensities within the basal ganglia most consistent with perivascular spaces. No susceptibility artifact to suggest hemorrhage.  No abnormal extra-axial fluid collections. Ocular globes and orbital contents are unremarkable though not tailored for evaluation. Trace paranasal sinus mucosal thickening without air-fluid levels. Mastoid air cells appear well-aerated. No abnormal sellar expansion. No cerebellar tonsillar ectopia. No suspicious calvarial bone marrow signal. No mass lesions, mass effect. No reduced diffusion to suggest acute ischemia. No susceptibility artifact to suggest hemorrhage.  No abnormal extra-axial fluid collections. No extra-axial masses though, contrast enhanced sequences would be more sensitive. Normal major intracranial vascular flow voids seen at the skull base.  Ocular globes and orbital contents are unremarkable though not tailored for evaluation. No abnormal sellar expansion. Visualized paranasal sinuses and mastoid air cells are well-aerated. No suspicious calvarial bone marrow signal. No abnormal sellar  expansion. Craniocervical junction maintained.  MRA HEAD FINDINGS  Anterior circulation: Normal flow related enhancement of the included cervical, petrous, cavernous and supra clinoid internal carotid arteries. Patent anterior communicating artery. Normal flow related enhancement of the anterior and middle cerebral arteries, including more distal segments.  Posterior circulation: Right vertebral artery is dominant.  Basilar artery is patent, with normal flow related enhancement of the main branch vessels. Normal flow related enhancement of the posterior cerebral arteries.  No large vessel occlusion, hemodynamically significant stenosis, aneurysm within the anterior nor posterior circulation. Mild intracranial luminal irregularity may reflect atherosclerosis, with most apparent involvement of the posterior cerebral arteries.  IMPRESSION: MRI of the brain: No acute intracranial process. Mild to moderate white matter changes suggest chronic small vessel ischemic disease, possibly secondary to patient's reported hypertension.  MRA of the head: No hemodynamically significant stenosis. Mild luminal irregularity of the intracranial vessels may reflect atherosclerosis.   Electronically Signed   By: Awilda Metro   On: 06/17/2013 05:33      Discharge Exam: Blood pressure 146/96, pulse 72, temperature 97.9 F (36.6 C), temperature source Oral, resp. rate 18, height 5\' 11"  (1.803 m), weight 97.387 kg (214 lb 11.2 oz), SpO2 100.00%. General appearance: alert and cooperative Resp: clear to auscultation bilaterally Cardio: regular rate and rhythm, S1, S2 normal, no murmur, click, rub or gallop Extremities: extremities normal, atraumatic, no cyanosis or edema  Disposition: discharge to home     Medication List         lisinopril-hydrochlorothiazide 10-12.5 MG per tablet  Commonly known as:  ZESTORETIC  Take 1 tablet by mouth daily.           Follow-up Information   Follow up with Demetruis Depaul D, MD  In 1 week.   Specialty:  Internal Medicine   Contact information:   [address] [place] [address] [phone]       Signed: Katy Apo 06/17/2013, 12:48 PM

## 2013-06-17 NOTE — Progress Notes (Signed)
NEURO HOSPITALIST PROGRESS NOTE   SUBJECTIVE:                                                                                                                        Patient has no further paresthesia or numbness and HA is rated at a 2-3/10, "only mild and in th front of my head".   OBJECTIVE:                                                                                                                           Vital signs in last 24 hours: Temp:  [97.6 F (36.4 C)-99 F (37.2 C)] 98 F (36.7 C) (01/13 0800) Pulse Rate:  [65-99] 66 (01/13 0800) Resp:  [13-26] 18 (01/13 0800) BP: (125-152)/(67-99) 144/67 mmHg (01/13 0800) SpO2:  [96 %-100 %] 98 % (01/13 0800) Weight:  [97.387 kg (214 lb 11.2 oz)] 97.387 kg (214 lb 11.2 oz) (01/12 1928)  Intake/Output from previous day:   Intake/Output this shift:   Nutritional status: Cardiac  Past Medical History  Diagnosis Date  . Hypertension   . Headache(784.0)     "frequently; not qd" (06/16/2013)     Neurologic Exam:  Mental Status: Alert, oriented, thought content appropriate.  Speech fluent without evidence of aphasia.  Able to follow 3 step commands without difficulty. Cranial Nerves: II: Discs flat bilaterally; Visual fields grossly normal, pupils equal, round, reactive to light and accommodation III,IV, VI: ptosis not present, extra-ocular motions intact bilaterally V,VII: smile symmetric, facial light touch sensation normal bilaterally VIII: hearing normal bilaterally IX,X: gag reflex present XI: bilateral shoulder shrug XII: midline tongue extension without atrophy or fasciculations  Motor: Right : Upper extremity   5/5    Left:     Upper extremity   5/5  Lower extremity   5/5     Lower extremity   5/5 Tone and bulk:normal tone throughout; no atrophy noted Sensory: Pinprick and light touch intact throughout, bilaterally Deep Tendon Reflexes:  Right: Upper Extremity   Left: Upper  extremity   biceps (C-5 to C-6) 2/4   biceps (C-5 to C-6) 2/4 tricep (C7) 2/4    triceps (C7) 2/4 Brachioradialis (C6) 2/4  Brachioradialis (C6) 2/4  Lower Extremity Lower Extremity  quadriceps (L-2 to L-4) 2/4   quadriceps (L-2 to L-4) 2/4 Achilles (S1) 2/4   Achilles (S1) 2/4  Plantars: Right: downgoing   Left: downgoing Cerebellar: normal finger-to-nose,  normal heel-to-shin test    Lab Results: Lab Results  Component Value Date/Time   CHOL 218* 06/17/2013  3:30 AM   Lipid Panel  Recent Labs  06/17/13 0330  CHOL 218*  TRIG 95  HDL 45  CHOLHDL 4.8  VLDL 19  LDLCALC 161*    Studies/Results: Dg Chest 2 View  06/16/2013   CLINICAL DATA:  Chest pain  EXAM: CHEST  2 VIEW  COMPARISON:  February 08, 2007  FINDINGS: There is an opacity either within or overlying the anterior left 1st rib measuring 1.1 x 1.4 cm. Elsewhere lungs are clear. Heart size and pulmonary vascularity are normal. No adenopathy.  IMPRESSION: Opacity either in or overlying the anterior left 1st rib. Given this circumstance, the would advise apical lordotic chest radiograph to further evaluate. If this area of opacity cannot be confirmed to reside within the anterior left 1st rib, correlation with noncontrast enhanced chest CT would be advised to further assess. Elsewhere lungs are clear.   Electronically Signed   By: Bretta Bang M.D.   On: 06/16/2013 15:09   Ct Head Wo Contrast  06/16/2013   CLINICAL DATA:  43 year old male with headache, chest pain, disoriented, difficulty speaking. Initial encounter.  EXAM: CT HEAD WITHOUT CONTRAST  TECHNIQUE: Contiguous axial images were obtained from the base of the skull through the vertex without intravenous contrast.  COMPARISON:  None.  FINDINGS: Minimal paranasal sinus mucosal thickening. Mastoids and tympanic cavities are clear. No acute osseous abnormality identified. Scattered mostly punctate scalp soft tissue calcifications, might in part be sequelae of remote  trauma. Dysconjugate gaze, otherwise negative orbits soft tissues.  Cerebral volume is normal. No midline shift, ventriculomegaly, mass effect, evidence of mass lesion, intracranial hemorrhage or evidence of cortically based acute infarction. Gray-white matter differentiation is within normal limits throughout the brain. No suspicious intracranial vascular hyperdensity.  IMPRESSION: Normal noncontrast CT appearance of the brain.   Electronically Signed   By: Augusto Gamble M.D.   On: 06/16/2013 15:15   Mri Brain Without Contrast  06/17/2013   CLINICAL DATA:  Chronic headaches, hypertension. Generalized weakness.  EXAM: MRI HEAD WITHOUT CONTRAST  MRA HEAD WITHOUT CONTRAST  TECHNIQUE: Multiplanar, multiecho pulse sequences of the brain and surrounding structures were obtained without intravenous contrast. Angiographic images of the head were obtained using MRA technique without contrast.  COMPARISON:  CT of the head June 16, 2013.  FINDINGS: MRI HEAD FINDINGS  The ventricles and sulci are normal for patient's age. Mild patchy non expansile pontine. T2 hyperintensities. Subcentimeter left periatrial nonspecific white matter lesion. Minimal PC FLAIR hyperintense signal within the deep parietal white matter. Punctate T2 hyperintensities within the basal ganglia most consistent with perivascular spaces. No susceptibility artifact to suggest hemorrhage.  No abnormal extra-axial fluid collections. Ocular globes and orbital contents are unremarkable though not tailored for evaluation. Trace paranasal sinus mucosal thickening without air-fluid levels. Mastoid air cells appear well-aerated. No abnormal sellar expansion. No cerebellar tonsillar ectopia. No suspicious calvarial bone marrow signal. No mass lesions, mass effect. No reduced diffusion to suggest acute ischemia. No susceptibility artifact to suggest hemorrhage.  No abnormal extra-axial fluid collections. No extra-axial masses though, contrast enhanced sequences  would be more sensitive. Normal major intracranial vascular flow voids seen at the skull base.  Ocular globes and orbital contents are  unremarkable though not tailored for evaluation. No abnormal sellar expansion. Visualized paranasal sinuses and mastoid air cells are well-aerated. No suspicious calvarial bone marrow signal. No abnormal sellar expansion. Craniocervical junction maintained.  MRA HEAD FINDINGS  Anterior circulation: Normal flow related enhancement of the included cervical, petrous, cavernous and supra clinoid internal carotid arteries. Patent anterior communicating artery. Normal flow related enhancement of the anterior and middle cerebral arteries, including more distal segments.  Posterior circulation: Right vertebral artery is dominant. Basilar artery is patent, with normal flow related enhancement of the main branch vessels. Normal flow related enhancement of the posterior cerebral arteries.  No large vessel occlusion, hemodynamically significant stenosis, aneurysm within the anterior nor posterior circulation. Mild intracranial luminal irregularity may reflect atherosclerosis, with most apparent involvement of the posterior cerebral arteries.  IMPRESSION: MRI of the brain: No acute intracranial process. Mild to moderate white matter changes suggest chronic small vessel ischemic disease, possibly secondary to patient's reported hypertension.  MRA of the head: No hemodynamically significant stenosis. Mild luminal irregularity of the intracranial vessels may reflect atherosclerosis.   Electronically Signed   By: Awilda Metro   On: 06/17/2013 05:33   Mr Maxine Glenn Head/brain Wo Cm  06/17/2013   CLINICAL DATA:  Chronic headaches, hypertension. Generalized weakness.  EXAM: MRI HEAD WITHOUT CONTRAST  MRA HEAD WITHOUT CONTRAST  TECHNIQUE: Multiplanar, multiecho pulse sequences of the brain and surrounding structures were obtained without intravenous contrast. Angiographic images of the head were  obtained using MRA technique without contrast.  COMPARISON:  CT of the head June 16, 2013.  FINDINGS: MRI HEAD FINDINGS  The ventricles and sulci are normal for patient's age. Mild patchy non expansile pontine. T2 hyperintensities. Subcentimeter left periatrial nonspecific white matter lesion. Minimal PC FLAIR hyperintense signal within the deep parietal white matter. Punctate T2 hyperintensities within the basal ganglia most consistent with perivascular spaces. No susceptibility artifact to suggest hemorrhage.  No abnormal extra-axial fluid collections. Ocular globes and orbital contents are unremarkable though not tailored for evaluation. Trace paranasal sinus mucosal thickening without air-fluid levels. Mastoid air cells appear well-aerated. No abnormal sellar expansion. No cerebellar tonsillar ectopia. No suspicious calvarial bone marrow signal. No mass lesions, mass effect. No reduced diffusion to suggest acute ischemia. No susceptibility artifact to suggest hemorrhage.  No abnormal extra-axial fluid collections. No extra-axial masses though, contrast enhanced sequences would be more sensitive. Normal major intracranial vascular flow voids seen at the skull base.  Ocular globes and orbital contents are unremarkable though not tailored for evaluation. No abnormal sellar expansion. Visualized paranasal sinuses and mastoid air cells are well-aerated. No suspicious calvarial bone marrow signal. No abnormal sellar expansion. Craniocervical junction maintained.  MRA HEAD FINDINGS  Anterior circulation: Normal flow related enhancement of the included cervical, petrous, cavernous and supra clinoid internal carotid arteries. Patent anterior communicating artery. Normal flow related enhancement of the anterior and middle cerebral arteries, including more distal segments.  Posterior circulation: Right vertebral artery is dominant. Basilar artery is patent, with normal flow related enhancement of the main branch  vessels. Normal flow related enhancement of the posterior cerebral arteries.  No large vessel occlusion, hemodynamically significant stenosis, aneurysm within the anterior nor posterior circulation. Mild intracranial luminal irregularity may reflect atherosclerosis, with most apparent involvement of the posterior cerebral arteries.  IMPRESSION: MRI of the brain: No acute intracranial process. Mild to moderate white matter changes suggest chronic small vessel ischemic disease, possibly secondary to patient's reported hypertension.  MRA of the head: No hemodynamically significant stenosis. Mild luminal  irregularity of the intracranial vessels may reflect atherosclerosis.   Electronically Signed   By: Awilda Metroourtnay  Bloomer   On: 06/17/2013 05:33    MEDICATIONS                                                                                                                        Scheduled: . aspirin  325 mg Oral Daily  . heparin  5,000 Units Subcutaneous Q8H  .  morphine injection  4 mg Intravenous Once    ASSESSMENT/PLAN:                                                                                                            Complicated migraine HA. HA now only a 2-3/10 and paresthesia has resolved.  MRI/MRA head negative for acute infarct or intracranial stenosis.   Recommend: 1) Treat HA symptomatically.    No further recommendations per neurology.  Neurology S/O   Assessment and plan discussed with with attending physician and they are in agreement.    Felicie MornDavid Xaviera Flaten PA-C [facility] [phone]  06/17/2013, 10:45 AM

## 2013-06-26 ENCOUNTER — Ambulatory Visit
Admission: RE | Admit: 2013-06-26 | Discharge: 2013-06-26 | Disposition: A | Discharge status: Home / Self Care | Payer: BC Managed Care – PPO | Source: Ambulatory Visit | Attending: Internal Medicine | Admitting: Internal Medicine

## 2013-06-26 ENCOUNTER — Other Ambulatory Visit: Payer: Self-pay | Admitting: Internal Medicine

## 2013-06-26 DIAGNOSIS — R918 Other nonspecific abnormal finding of lung field: Secondary | ICD-10-CM

## 2014-02-18 ENCOUNTER — Ambulatory Visit (HOSPITAL_BASED_OUTPATIENT_CLINIC_OR_DEPARTMENT_OTHER): Payer: 59

## 2014-04-02 ENCOUNTER — Ambulatory Visit (HOSPITAL_BASED_OUTPATIENT_CLINIC_OR_DEPARTMENT_OTHER): Payer: 59 | Attending: Internal Medicine | Admitting: Radiology

## 2014-04-02 VITALS — Ht 71.0 in | Wt 210.0 lb

## 2014-04-02 DIAGNOSIS — G4730 Sleep apnea, unspecified: Secondary | ICD-10-CM | POA: Insufficient documentation

## 2014-04-02 DIAGNOSIS — G4710 Hypersomnia, unspecified: Secondary | ICD-10-CM | POA: Diagnosis present

## 2014-04-02 DIAGNOSIS — G4733 Obstructive sleep apnea (adult) (pediatric): Secondary | ICD-10-CM

## 2014-04-04 NOTE — Sleep Study (Signed)
   NAME: James MarvelBruce Hernandez DATE OF BIRTH:  11/15/1970 MEDICAL RECORD NUMBER [id number]  LOCATION: [facility]  PHYSICIAN: Eliodoro Gullett D  DATE OF STUDY: 04/02/2014  SLEEP STUDY TYPE: Nocturnal Polysomnogram               REFERRING PHYSICIAN: Polite, Deirdre Peeronald D, MD  INDICATION FOR STUDY: Hypersomnia with sleep apnea-CPAP titration  EPWORTH SLEEPINESS SCORE:   6/24 HEIGHT: 5\' 11"  (180.3 cm)  WEIGHT: 210 lb (95.255 kg)    Body mass index is 29.3 kg/(m^2).  NECK SIZE: 17 in.  MEDICATIONS: Charted for review  SLEEP ARCHITECTURE: Total sleep time 321.5 minutes with sleep efficiency 87.7%. Stage I was 7.2%, stage II 42.9%, stage III absent, REM 49.9% of total sleep time.  RESPIRATORY DATA: CPAP titration protocol. CPAP was titrated to 11 CWP, AHI 0 per hour. He wore a medium nasal mask.  OXYGEN DATA: Snoring was prevented with CPAP and mean oxygen saturation was 97.7% on room air  CARDIAC DATA: Sinus rhythm with PVCs  MOVEMENT/PARASOMNIA: No significant movement disturbance, no bathroom trips  IMPRESSION/ RECOMMENDATION:   1) Successful CPAP titration to 11 CWP, AHI 0 per hour. He wore a medium [employer] Eson nasal mask with heated humidifier. Snoring was prevented and mean oxygen saturation was 97.7% on room air. 2) An unusually high percentage of sleep time was scored as REM. This may indicate REM rebound. 3) Baseline polysomnogram on 07/23/2013 recorded AHI 15.8 per hour. Body weight was 210 pounds with that study.   Waymon BudgeYOUNG,Atilano Covelli D Diplomate, American Board of Sleep Medicine  ELECTRONICALLY SIGNED ON:  04/04/2014, 1:42 PM  [facility] PH: [phone]   FX: [phone] ACCREDITED BY THE AMERICAN ACADEMY OF SLEEP MEDICINE

## 2015-04-29 IMAGING — CR DG CHEST 2V
2 series · 2 of 2 positions shown · non-contrast
Comparison: February 08, 2007

CLINICAL DATA: Chest pain

EXAM:
CHEST  2 VIEW

[w chest lat]
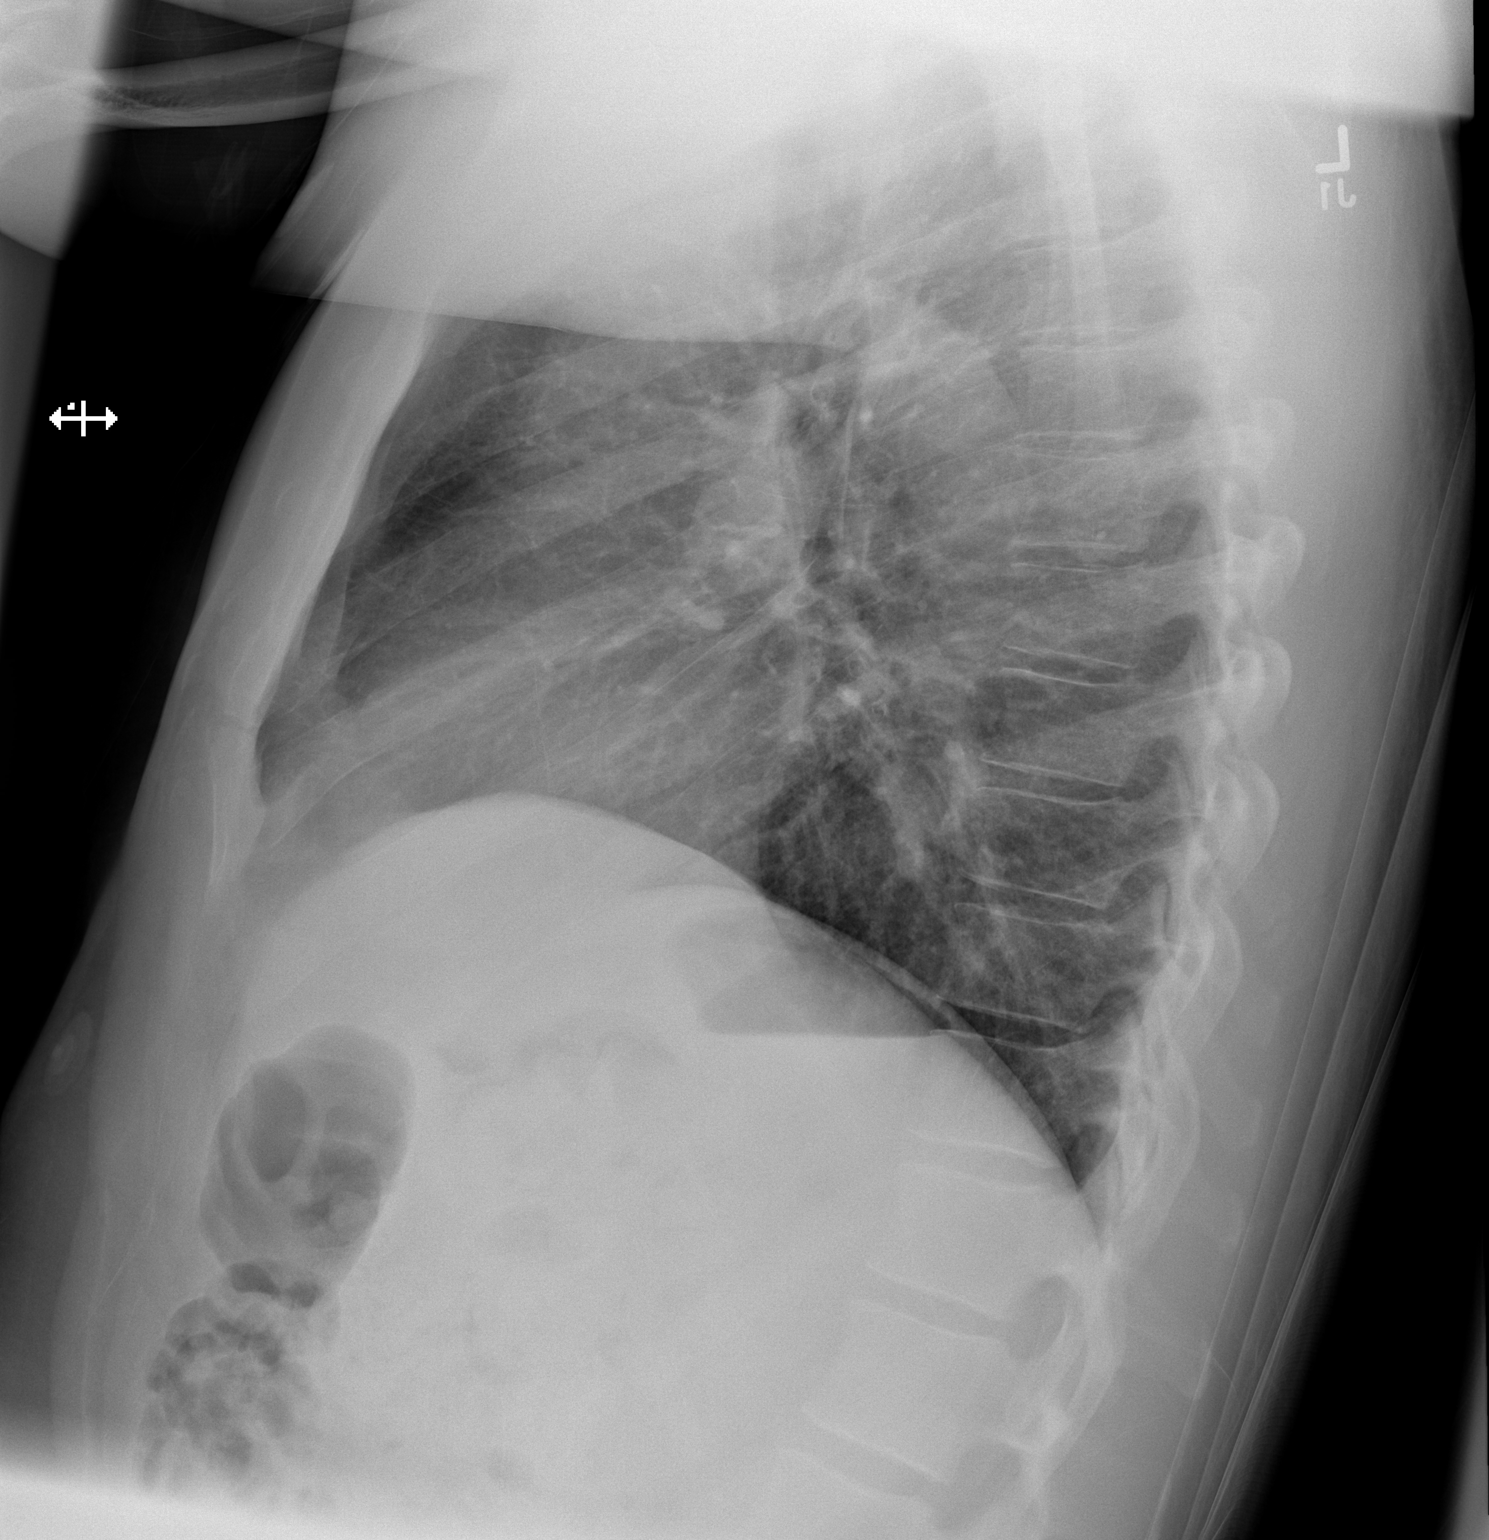

[x chest ap]
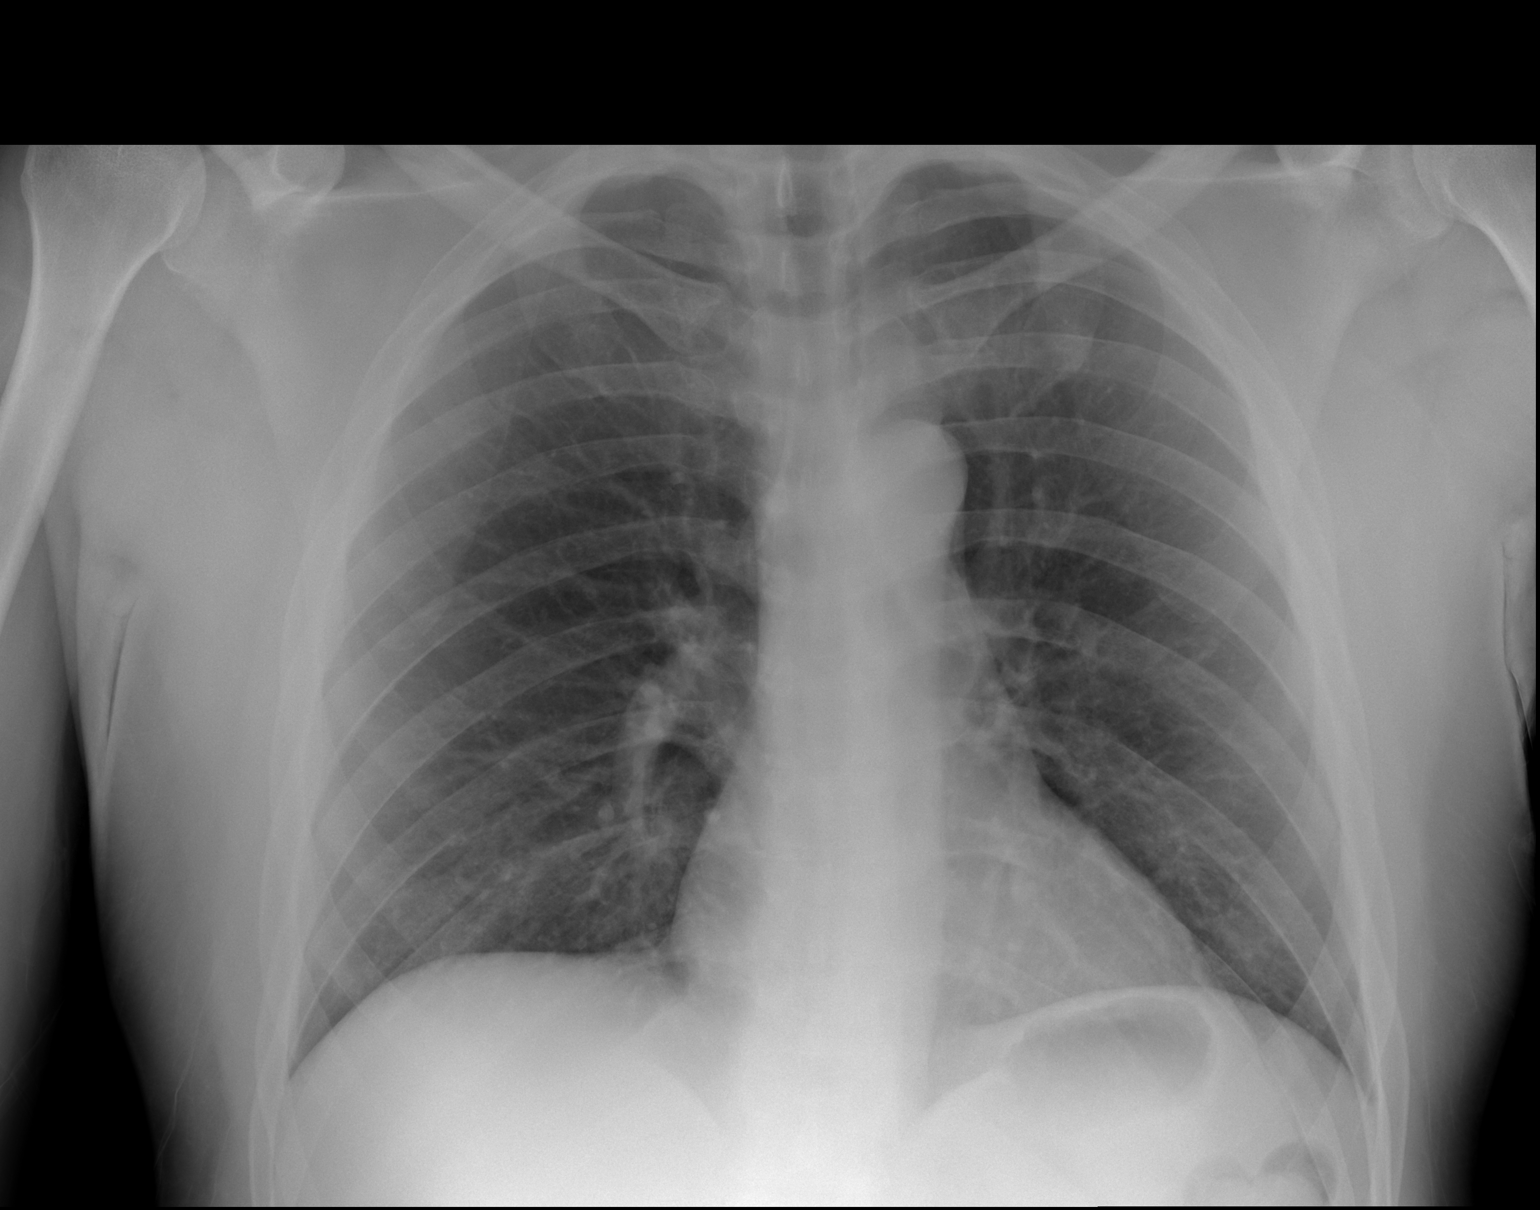

[2 of 2 positions shown; findings below may reference images not displayed]

FINDINGS: There is an opacity either within or overlying the anterior left 1st
rib measuring 1.1 x 1.4 cm. Elsewhere lungs are clear. Heart size
and pulmonary vascularity are normal. No adenopathy.
IMPRESSION: Opacity either in or overlying the anterior left 1st rib. Given this
circumstance, the would advise apical lordotic chest radiograph to
further evaluate. If this area of opacity cannot be confirmed to
reside within the anterior left 1st rib, correlation with
noncontrast enhanced chest CT would be advised to further assess.
Elsewhere lungs are clear.

## 2015-10-22 ENCOUNTER — Emergency Department (HOSPITAL_COMMUNITY)
Admission: EM | Admit: 2015-10-22 | Discharge: 2015-10-23 | Disposition: A | Discharge status: Home / Self Care | Payer: 59 | Attending: Emergency Medicine | Admitting: Emergency Medicine

## 2015-10-22 ENCOUNTER — Encounter (HOSPITAL_COMMUNITY): Payer: Self-pay | Admitting: Emergency Medicine

## 2015-10-22 DIAGNOSIS — F1910 Other psychoactive substance abuse, uncomplicated: Secondary | ICD-10-CM

## 2015-10-22 DIAGNOSIS — F419 Anxiety disorder, unspecified: Secondary | ICD-10-CM | POA: Insufficient documentation

## 2015-10-22 DIAGNOSIS — R079 Chest pain, unspecified: Secondary | ICD-10-CM | POA: Diagnosis not present

## 2015-10-22 DIAGNOSIS — F1410 Cocaine abuse, uncomplicated: Secondary | ICD-10-CM | POA: Diagnosis not present

## 2015-10-22 DIAGNOSIS — F32A Depression, unspecified: Secondary | ICD-10-CM

## 2015-10-22 DIAGNOSIS — Z79899 Other long term (current) drug therapy: Secondary | ICD-10-CM | POA: Insufficient documentation

## 2015-10-22 DIAGNOSIS — I10 Essential (primary) hypertension: Secondary | ICD-10-CM | POA: Diagnosis not present

## 2015-10-22 DIAGNOSIS — F1210 Cannabis abuse, uncomplicated: Secondary | ICD-10-CM | POA: Insufficient documentation

## 2015-10-22 DIAGNOSIS — F14182 Cocaine abuse with cocaine-induced sleep disorder: Secondary | ICD-10-CM

## 2015-10-22 DIAGNOSIS — F331 Major depressive disorder, recurrent, moderate: Secondary | ICD-10-CM

## 2015-10-22 DIAGNOSIS — F329 Major depressive disorder, single episode, unspecified: Secondary | ICD-10-CM | POA: Diagnosis not present

## 2015-10-22 LAB — COMPREHENSIVE METABOLIC PANEL
ALK PHOS: 51 U/L (ref 38–126)
ALT: 21 U/L (ref 17–63)
AST: 26 U/L (ref 15–41)
Albumin: 4.4 g/dL (ref 3.5–5.0)
Anion gap: 9 (ref 5–15)
BUN: 7 mg/dL (ref 6–20)
CALCIUM: 9.9 mg/dL (ref 8.9–10.3)
CO2: 28 mmol/L (ref 22–32)
CREATININE: 1.4 mg/dL — AB (ref 0.61–1.24)
Chloride: 104 mmol/L (ref 101–111)
GFR calc Af Amer: >60 mL/min (ref >60)
GFR, EST NON AFRICAN AMERICAN: 60 mL/min — AB (ref >60)
Glucose, Bld: 128 mg/dL — ABNORMAL HIGH (ref 65–99)
Potassium: 3.5 mmol/L (ref 3.5–5.1)
Sodium: 141 mmol/L (ref 135–145)
Total Bilirubin: 1.3 mg/dL — ABNORMAL HIGH (ref 0.3–1.2)
Total Protein: 8 g/dL (ref 6.5–8.1)

## 2015-10-22 LAB — CBC
HCT: 50.5 % (ref 39.0–52.0)
Hemoglobin: 16.8 g/dL (ref 13.0–17.0)
MCH: 28.5 pg (ref 26.0–34.0)
MCHC: 33.3 g/dL (ref 30.0–36.0)
MCV: 85.7 fL (ref 78.0–100.0)
PLATELETS: 232 10*3/uL (ref 150–400)
RBC: 5.89 MIL/uL — AB (ref 4.22–5.81)
RDW: 14.1 % (ref 11.5–15.5)
WBC: 8.3 10*3/uL (ref 4.0–10.5)

## 2015-10-22 LAB — RAPID URINE DRUG SCREEN, HOSP PERFORMED
AMPHETAMINES: NOT DETECTED
Barbiturates: NOT DETECTED
Benzodiazepines: NOT DETECTED
Cocaine: POSITIVE — AB
OPIATES: NOT DETECTED
Tetrahydrocannabinol: POSITIVE — AB

## 2015-10-22 LAB — I-STAT TROPONIN, ED: TROPONIN I, POC: 0 ng/mL (ref 0.00–0.08)

## 2015-10-22 LAB — TROPONIN I: Troponin I: 0.03 ng/mL (ref <0.031)

## 2015-10-22 MED ORDER — ONDANSETRON HCL 4 MG PO TABS
4.0000 mg | ORAL_TABLET | Freq: Three times a day (TID) | ORAL | Status: DC | PRN
Start: 2015-10-22 — End: 2015-10-23

## 2015-10-22 MED ORDER — LORAZEPAM 1 MG PO TABS: 1.0000 mg | ORAL_TABLET | Freq: Three times a day (TID) | ORAL | Status: DC | PRN

## 2015-10-22 MED ORDER — ACETAMINOPHEN 325 MG PO TABS: 650.0000 mg | ORAL_TABLET | ORAL | Status: DC | PRN

## 2015-10-22 MED ORDER — IBUPROFEN 400 MG PO TABS: 600.0000 mg | ORAL_TABLET | Freq: Three times a day (TID) | ORAL | Status: DC | PRN

## 2015-10-22 MED ORDER — LORAZEPAM 1 MG PO TABS
1.0000 mg | ORAL_TABLET | Freq: Once | ORAL | Status: AC
  Administered 2015-10-22: 1 mg via ORAL
  Filled 2015-10-22: qty 1

## 2015-10-22 MED ORDER — ZOLPIDEM TARTRATE 5 MG PO TABS: 5.0000 mg | ORAL_TABLET | Freq: Every evening | ORAL | Status: DC | PRN

## 2015-10-22 NOTE — ED Provider Notes (Signed)
CSN: [id number]     Arrival date & time 10/22/15  1337 History   First MD Initiated Contact with Patient 10/22/15 1559     Chief Complaint  Patient presents with  . Anxiety     (Consider location/radiation/quality/duration/timing/severity/associated sxs/prior Treatment) HPI  2 days of anxiety (thoughts of not being here - no plan) - 2 days of mania - lots of ETOH, cocaine, MJ and others (poor memory).  Similar in the past over 6 months - never been dx with mental illness - he does have HTN.  CP has been ongoing constant, described as Tightness / stretching in the chest - no diaphoresis / nauseas or vomiting and no radiation of the pain.  Last cocaine use was 12 hours ago - The patient reports that he is having several days and a row without sleeping, he is having increased mania with increased spending, mind racing, unable to control himself with increased substance abuse, states that after separation with his wife a year ago he started to have a downturn. He has been using cocaine 3 times in the last week and used heroin 2 weeks ago for the first time. Drinking more heavily, smoking more frequent marijuana, missing work.  Past Medical History  Diagnosis Date  . Hypertension   . Headache(784.0)     "frequently; not qd" (06/16/2013)   Past Surgical History  Procedure Laterality Date  . No past surgeries     History reviewed. No pertinent family history. Social History  Substance Use Topics  . Smoking status: Never Smoker   . Smokeless tobacco: Never Used  . Alcohol Use: Yes    Review of Systems  All other systems reviewed and are negative.     Allergies  Review of patient's allergies indicates no known allergies.  Home Medications   Prior to Admission medications   Medication Sig Start Date End Date Taking? Authorizing Provider  amLODipine (NORVASC) 5 MG tablet Take 5 mg by mouth daily.   Yes Historical Provider, MD  ibuprofen (ADVIL,MOTRIN) 200 MG tablet Take 400 mg by  mouth daily as needed for moderate pain.   Yes Historical Provider, MD   BP 150/98 mmHg  Pulse 70  Temp(Src) 98.4 F (36.9 C) (Oral)  Resp 18  SpO2 97% Physical Exam  Constitutional: He appears well-developed and well-nourished. No distress.  HENT:  Head: Normocephalic and atraumatic.  Mouth/Throat: Oropharynx is clear and moist. No oropharyngeal exudate.  Eyes: Conjunctivae and EOM are normal. Pupils are equal, round, and reactive to light. Right eye exhibits no discharge. Left eye exhibits no discharge. No scleral icterus.  Neck: Normal range of motion. Neck supple. No JVD present. No thyromegaly present.  Cardiovascular: Normal rate, regular rhythm, normal heart sounds and intact distal pulses.  Exam reveals no gallop and no friction rub.   No murmur heard. Hypertension   Pulmonary/Chest: Effort normal and breath sounds normal. No respiratory distress. He has no wheezes. He has no rales.  Abdominal: Soft. Bowel sounds are normal. He exhibits no distension and no mass. There is no tenderness.  Musculoskeletal: Normal range of motion. He exhibits no edema or tenderness.  Lymphadenopathy:    He has no cervical adenopathy.  Neurological: He is alert. Coordination normal.  Skin: Skin is warm and dry. No rash noted. No erythema.  Psychiatric: He has a normal mood and affect. His behavior is normal.  Nursing note and vitals reviewed.   ED Course  Procedures (including critical care time) Labs Review Labs Reviewed  COMPREHENSIVE METABOLIC PANEL - Abnormal; Notable for the following:    Glucose, Bld 128 (*)    Creatinine, Ser 1.40 (*)    Total Bilirubin 1.3 (*)    GFR calc non Af Amer 60 (*)    All other components within normal limits  CBC - Abnormal; Notable for the following:    RBC 5.89 (*)    All other components within normal limits  URINE RAPID DRUG SCREEN, HOSP PERFORMED - Abnormal; Notable for the following:    Cocaine POSITIVE (*)    Tetrahydrocannabinol POSITIVE (*)     All other components within normal limits  TROPONIN I  TROPONIN I  I-STAT TROPOININ, ED    Imaging Review No results found. I have personally reviewed and evaluated these images and lab results as part of my medical decision-making.   EKG Interpretation   Date/Time:  Friday Oct 22 2015 13:43:29 EDT Ventricular Rate:  101 PR Interval:  156 QRS Duration: 92 QT Interval:  332 QTC Calculation: 430 R Axis:   99 Text Interpretation:  Sinus tachycardia Rightward axis Left ventricular  hypertrophy REPOLARIZATION ABNORMALITY Abnormal ekg since last tracing no  significant change Confirmed by Hyacinth Meeker  MD, Andrzej Scully ([id number]) on 10/22/2015  4:07:28 PM      MDM   Final diagnoses:  Anxiety  Substance abuse  Depression    Pressured speech, appears anxious  #1 - hypertension - recheck may need tx (unlikely to be taking meds - give dose) #2 - CP - has unchanged ECG other than tachy - trop neg - repeat trop #3 - Anxiety / mania - Benzo, consult psych #4 - not actively suicidal - psych  Pt has Unremarkable repeat EKG, metabolic panel is unremarkable, blood counts are unremarkable, psychiatry has seen the patient and as he is not acutely a danger to himself or others and has no active suicidal thoughts they feel that the patient can be seen outpatient if in the morning he has a repeat exam which is non-concerning. The patient will be placed in psych hold in Pod C pending morning evaluation by the nurse practitioner  Eber Hong, MD 10/24/15 [phone]

## 2015-10-22 NOTE — ED Notes (Signed)
Pt here with increased anxiety and stress with some tightness in chest; pt requesting to talk with mental health people; pt denies SI

## 2015-10-22 NOTE — BH Assessment (Signed)
Tele Assessment Note   James MarvelBruce Hernandez is a 45 y.o. male who [facility] of chest pain and sleeplessness.  Pt is medically cleared.  Pt reported as follows:  Pt has been awake for the last 48-72 hours, using drugs (cocaine and marijuana) and alcohol.  He reported that he has had no more than 12 hours of sleep in the 72 hour period.  When asked about triggers, Pt stated that he is upset about separation from his wife (per Pt, the two still live together, but .  Pt reported that his use of cocaine and marijuana has been episodic since July 2016, but that he has used heavily over the last week.  He also endorsed one instance of heroin use about a week ago and frequent use of alcohol.  BAC was clear; UDS was not available at this writing.  In addition to substance use and sleeplessness, Pt endorsed racing thoughts, feeling of hopelessness, and (per report) increased spending.  When asked what would happen if he were discharged, Pt stated that he is likely to use drugs again.    Pt denied suicidal or homicidal ideation.  He also denied a history of self-injury or hallucination.  Pt stated that he has never been treated for psychiatric or substance use disorders, and he is not taking any psychotropic medication.  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  Consulted with attending physician, who expressed concern that Pt is decompensating.  Consulted with L. Earlene Plateravis, NP, who stated that given totality of circumstances, Pt should be observed overnight and UDS collected, with Pt to be reassessed by Ms. Davis in AM.  Diagnosis: Polysubstance Use Disorder; r/o Bipolar I Manic; r/o Substance-induced manic episode  Past Medical History:  Past Medical History  Diagnosis Date  . Hypertension   . Headache(784.0)     "frequently; not qd" (06/16/2013)    Past Surgical History  Procedure Laterality Date  . No past surgeries      Family History: History reviewed. No pertinent family  history.  Social History:  reports that he has never smoked. He has never used smokeless tobacco. He reports that he drinks alcohol. He reports that he uses illicit drugs (Cocaine and Marijuana).  Additional Social History:  Alcohol / Drug Use Pain Medications: See PTA Prescriptions: See PTA Over the Counter: See PTA History of alcohol / drug use?: Yes (Cocaine, Marijuana, 1 use of Heroin) Substance #1 Name of Substance 1: Cocaine 1 - Age of First Use: 43 1 - Amount (size/oz): Varied 1 - Frequency: Episodic 1 - Duration: Ongoing 1 - Last Use / Amount: 10/21/15 (UDS not available at this writing) Substance #2 Name of Substance 2: Marijuana 2 - Amount (size/oz): Varies 2 - Frequency: Episodic 2 - Duration: Ongoing 2 - Last Use / Amount: 10/22/15  CIWA: CIWA-Ar BP: (!) 162/112 mmHg Pulse Rate: 97 COWS:    PATIENT STRENGTHS: (choose at least two) Capable of independent living Communication skills General fund of knowledge  Allergies: No Known Allergies  Home Medications:  (Not in a hospital admission)  OB/GYN Status:  No LMP for male patient.  General Assessment Data [facility] Assessment: In system Is this a Tele or Face-to-Face Assessment?: Tele Assessment Is this an Initial Assessment or a Re-assessment for this encounter?: Initial Assessment Marital status: Married Is patient pregnant?: No Pregnancy Status: No Living Arrangements: Spouse/significant other, Children Can pt return to current living arrangement?: Yes Admission Status: Voluntary Is patient  capable of signing voluntary admission?: Yes Referral Source: Self/Family/Friend Insurance type: Self-pay  Medical Screening Exam [facility] Walk-in ONLY) Medical Exam completed: Yes  Crisis Care Plan Living Arrangements: Spouse/significant other, Children Name of Psychiatrist: NA  Education Status Is patient currently in school?: No  Risk to self with the past 6 months Suicidal Ideation:  No Has patient been a risk to self within the past 6 months prior to admission? : No Suicidal Intent: No Has patient had any suicidal intent within the past 6 months prior to admission? : No Is patient at risk for suicide?: No Suicidal Plan?: No Has patient had any suicidal plan within the past 6 months prior to admission? : No Access to Means: No What has been your use of drugs/alcohol within the last 12 months?: Cocaine, Marijuana, Heroin Previous Attempts/Gestures: No Intentional Self Injurious Behavior: None Family Suicide History: No Recent stressful life event(s): Conflict (Comment) (Separation from wife) Persecutory voices/beliefs?: No Depression: Yes Depression Symptoms: Despondent, Isolating, Feeling worthless/self pity, Loss of interest in usual pleasures Substance abuse history and/or treatment for substance abuse?: Yes Suicide prevention information given to non-admitted patients: Not applicable  Risk to Others within the past 6 months Homicidal Ideation: No Does patient have any lifetime risk of violence toward others beyond the six months prior to admission? : No Thoughts of Harm to Others: No Current Homicidal Intent: No Current Homicidal Plan: No Access to Homicidal Means: No History of harm to others?: No Assessment of Violence: None Noted Does patient have access to weapons?: No Criminal Charges Pending?: No Does patient have a court date: No Is patient on probation?: No  Psychosis Hallucinations: None noted  Mental Status Report Appearance/Hygiene: Unable to Assess Eye Contact: Unable to Assess Motor Activity: Unable to assess Speech: Unremarkable, Logical/coherent Level of Consciousness: Alert Mood: Helpless, Sad, Preoccupied Affect: Appropriate to circumstance Anxiety Level: None Thought Processes: Coherent, Relevant Judgement: Impaired Orientation: Person, Place, Time, Situation Obsessive Compulsive Thoughts/Behaviors: None  Cognitive  Functioning Concentration: Fair Memory: Recent Intact, Remote Intact IQ: Average Insight: Poor Impulse Control: Poor Appetite: Good Sleep: Decreased Total Hours of Sleep: 12 (over last 72 hours) Vegetative Symptoms: Unable to Assess  ADLScreening [facility] Assessment Services) Patient's cognitive ability adequate to safely complete daily activities?: Yes Patient able to express need for assistance with ADLs?: Yes Independently performs ADLs?: Yes (appropriate for developmental age)  Prior Inpatient Therapy Prior Inpatient Therapy: No  Prior Outpatient Therapy Prior Outpatient Therapy: No Does patient have an ACCT team?: No Does patient have Intensive In-House Services?  : No Does patient have [facility] services? : No Does patient have P4CC services?: No  ADL Screening (condition at time of admission) Patient's cognitive ability adequate to safely complete daily activities?: Yes Is the patient deaf or have difficulty hearing?: No Does the patient have difficulty seeing, even when wearing glasses/contacts?: No Does the patient have difficulty concentrating, remembering, or making decisions?: No Patient able to express need for assistance with ADLs?: Yes Does the patient have difficulty dressing or bathing?: No Independently performs ADLs?: Yes (appropriate for developmental age) Does the patient have difficulty walking or climbing stairs?: No Weakness of Legs: None Weakness of Arms/Hands: None  Home Assistive Devices/Equipment Home Assistive Devices/Equipment: None    Abuse/Neglect Assessment (Assessment to be complete while patient is alone) Physical Abuse: Denies Verbal Abuse: Denies Sexual Abuse: Denies Exploitation of patient/patient's resources: Denies Self-Neglect: Denies Values / Beliefs Cultural Requests During Hospitalization: None Spiritual Requests During Hospitalization: None   Advance Directives (For Healthcare)  Does patient have an advance directive?:  No Would patient like information on creating an advanced directive?: No - patient declined information    Additional Information 1:1 In Past 12 Months?: No CIRT Risk: No Elopement Risk: No Does patient have medical clearance?: Yes     Disposition:  Disposition Initial Assessment Completed for this Encounter: Yes Disposition of Patient: Other dispositions Other disposition(s):  (Per L. Earlene Plater, NP, Pt to be observed overnight, get labs, rea)  Earline Mayotte 10/22/2015 5:49 PM

## 2015-10-23 DIAGNOSIS — F14182 Cocaine abuse with cocaine-induced sleep disorder: Secondary | ICD-10-CM | POA: Diagnosis not present

## 2015-10-23 DIAGNOSIS — F331 Major depressive disorder, recurrent, moderate: Secondary | ICD-10-CM

## 2015-10-23 LAB — TROPONIN I

## 2015-10-23 NOTE — ED Notes (Signed)
Pt aware visitor is here to see him - will have to wait [facility]. Voiced understanding.

## 2015-10-23 NOTE — ED Notes (Signed)
Oupt Referrals given w/d/c paperwork.

## 2015-10-23 NOTE — Discharge Instructions (Signed)
Generalized Anxiety Disorder Generalized anxiety disorder (GAD) is a mental disorder. It interferes with life functions, including relationships, work, and school. GAD is different from normal anxiety, which everyone experiences at some point in their lives in response to specific life events and activities. Normal anxiety actually helps us prepare for and get through these life events and activities. Normal anxiety goes away after the event or activity is over.  GAD causes anxiety that is not necessarily related to specific events or activities. It also causes excess anxiety in proportion to specific events or activities. The anxiety associated with GAD is also difficult to control. GAD can vary from mild to severe. People with severe GAD can have intense waves of anxiety with physical symptoms (panic attacks).  SYMPTOMS The anxiety and worry associated with GAD are difficult to control. This anxiety and worry are related to many life events and activities and also occur more days than not for 6 months or longer. People with GAD also have three or more of the following symptoms (one or more in children):  Restlessness.   Fatigue.  Difficulty concentrating.   Irritability.  Muscle tension.  Difficulty sleeping or unsatisfying sleep. DIAGNOSIS GAD is diagnosed through an assessment by your health care provider. Your health care provider will ask you questions aboutyour mood,physical symptoms, and events in your life. Your health care provider may ask you about your medical history and use of alcohol or drugs, including prescription medicines. Your health care provider may also do a physical exam and blood tests. Certain medical conditions and the use of certain substances can cause symptoms similar to those associated with GAD. Your health care provider may refer you to a mental health specialist for further evaluation. TREATMENT The following therapies are usually used to treat GAD:    Medication. Antidepressant medication usually is prescribed for long-term daily control. Antianxiety medicines may be added in severe cases, especially when panic attacks occur.   Talk therapy (psychotherapy). Certain types of talk therapy can be helpful in treating GAD by providing support, education, and guidance. A form of talk therapy called cognitive behavioral therapy can teach you healthy ways to think about and react to daily life events and activities.  Stress managementtechniques. These include yoga, meditation, and exercise and can be very helpful when they are practiced regularly. A mental health specialist can help determine which treatment is best for you. Some people see improvement with one therapy. However, other people require a combination of therapies.   This information is not intended to replace advice given to you by your health care provider. Make sure you discuss any questions you have with your health care provider.   Document Released: 09/16/2012 Document Revised: 06/12/2014 Document Reviewed: 09/16/2012 Elsevier Interactive Patient Education 2016 [employer] State [employer] CorporationCommunity Resource Guide Outpatient Counseling/Substance Abuse Adult The United Ways 211 is a great source of information about community services available.  Access by dialing 2-1-1 from anywhere in [place], or by website -  PooledIncome.plwww.nc211.org.   Other Local Resources (Updated 06/2015)  Crisis Hotlines   Services     Area Served  [employer] [facility]  Crisis Hotline, available 24 hours a day, 7 days a week: [phone]([phone] [facility], [place]   [facility]  Crisis Hotline, available 24 hours a day, 7 days a week: [phone] [facility], [place]  [facility]  Suicide Prevention Hotline, available 24 hours a day, 7 days a week: 575-013-5788([phone]) [phone] [place] [facility], [place]  [facility]   Crisis Hotline, available 5224  hours a day, 7 days a week: [phone] [facility],  [place]   [facility] Access to [employer] Hotline, available 24 hours a day, 7 days a week: 438-098-5831 All   [facility]  Crisis Hotline, available 24 hours a day, 7 days a week: 619-773-9178 All   Other Local Resources (Updated 06/2015)  Outpatient Counseling/ Substance Abuse Programs  Services     Address and Phone Number  ADS (Alcohol and Drug Services)   Options include Individual counseling, group counseling, intensive outpatient program (several hours a day, several days a week)  Offers depression assessments  Provides methadone maintenance program [phone] [address] [place], [place] [address]   [facility]   Offers partial hospitalization/day treatment and DUI/DWI programs  [employer], private insurance [phone] [address] [place], [place] [address]  [facility]    Services include intensive outpatient program (several hours a day, several days a week), outpatient treatment, DUI/DWI services, family education  Also has some services specifically for [employer] transitional housing  [phone] [address] [place], [place] [address]     [facility]  [employer], private pay, and private insurance [phone] [address] [place], [place] [address]  [employer] [facility]  Services include individual counseling, substance abuse intensive outpatient program (several hours a day, several days a week), day treatment  Delene Loll, Medicaid, private insurance [phone] [address] [place], [place] [address]  [facility]   Offers substance abuse intensive outpatient program (several hours a day, several days a week), partial hospitalization program [phone] [address] [place], [place] [address]  [phone] [address] [place], [place]  [address]  [phone] [address] [place], [place] [address]  [phone] [place], [place] [address]  [facility]  Individual counseling only  Accepts private insurance only [phone] [address] [place], [place] [address]  [facility]  Methadone maintenance program [phone] [address] [place], [place] [address]  [facility]  Walk-In Clinic providing substance abuse and mental health counseling  Accepts Medicaid, Medicare, private insurance  Offers sliding scale for uninsured [phone] [address] [place], [place]   [facility] [place], [employer].  Offers individual counseling, and intensive in-home services [phone] [address] [place], [place] [address]  [facility] individual counseling, family counseling, group therapy, domestic violence counseling, consumer credit counseling  Accepts Medicare, Medicaid, private insurance  Offers sliding scale for uninsured [phone] [address] [place], [place] [address]  [phone] [facility], [address] [place], [place] [address]  [facility]  Offers individual, family and group counseling  3 locations - [place], [place], and [place]  [phone]  [address] [place], [place] [address]  [address] [place], [place] [address]  [address] [place], [place] [address]  [facility]    Offers psychiatric assessment, 8-week Intensive Outpatient Program (several hours a day, several times a week, daytime or evenings), early recovery group, family Program, medication management  Private pay or private insurance only [phone], or  [phone] [address] [place], [place] [address]  [place] [employer] individual, couples and family counseling  Accepts Medicaid, private insurance, and sliding scale for uninsured [phone] [address] [place], [place] [address]  Len Blalock, MD  Individual counseling  Private insurance  [phone] [address] [place], [place] [address]  [facility]   Offers assessment, substance abuse treatment, and behavioral health treatment [phone] [place], [place] [address]  [facility]  Individual counseling  Accepts private insurance [phone] [address] [place], [place] [address]  [facility]  Individual counseling  Delene Loll, private insurance [phone] [address] [place], [place] [address]  [facility]    Offers intensive outpatient program (several hours a day, several times a week)  Private pay, private insurance [phone] [facility] [place], [place]  [facility]  Individual counseling  Medicare, private insurance [phone] [address] [place], [place] [address]  [facility]    Offers intensive outpatient program (several hours a day, several times a week) and partial hospitalization program [phone] [address] [place], [place] [address]  Emerson Monte, MD  Individual counseling [phone] [address] [place], [place] [address]  [facility]  Offers Christian counseling to individuals, couples, and families  Accepts Medicare and private insurance; offers sliding scale for uninsured [phone] [address] [place], [place] [address]  [facility]  [place] counseling [phone] [address] [place], [place] [address]  [facility] crisis counseling, individual counseling, group therapy, in-home therapy, domestic violence services, day treatment, DWI services, Administrator, arts (CST), Assertive Community Treatment Team (ACTT), substance abuse Intensive Outpatient  Program (several hours a day, several times a week)  2 locations - [place] and [place] [phone] [address] [place], [place] [address]  [phone] [address] [place], [place] [address]  [facility]     Individual counseling and group therapy  Accepts private insurance, [place], [place] [phone] [address] [place], [place]  [facility]  Offers individual and family counseling  Offers LGBTQ services  Accepts private insurance and private pay [phone] [address] [place], [place] [address]  [facility]    Offers individual counseling, group therapy, and outpatient detox  Accepts private insurance [phone] [address] [place], [place]  [facility]  Individual counseling  Accepts Medicare, private insurance [phone] [address] [place], [place] [address]  [employer]  Individual counseling  Accepts Medicare, private insurance [phone] [address] [place], [place] [address]  [facility]   Offers substance abuse Intensive Outpatient Program (several hours a day, several times a week) [phone], or [phone] [place], [place]

## 2015-10-23 NOTE — ED Notes (Signed)
Visitor in w/pt d/t tx plan - d/c w/referrals.

## 2015-10-23 NOTE — ED Notes (Signed)
Breakfast tray ordered 

## 2015-10-23 NOTE — ED Notes (Signed)
Telepsych being performed. 

## 2015-10-23 NOTE — ED Notes (Signed)
Pt's belongings noted to be in belongings bag in chair at bedside. Pt has his cell phone w/cord plugged into wall. Pt wearing glasses. Will remove if needed after Telepsych.

## 2015-10-23 NOTE — Consult Note (Signed)
Telepsych Consultation   Reason for Consult:  Depression, Substance abuse  Referring Physician: EDP Patient Identification: James Hernandez MRN:  [id number] Principal Diagnosis: MDD (major depressive disorder), recurrent episode, moderate ([place]) Diagnosis:   Patient Active Problem List   Diagnosis Date Noted  . MDD (major depressive disorder), recurrent episode, moderate ([place]) [F33.1] 10/23/2015  . Cocaine abuse with cocaine-induced sleep disorder ([place]) [F14.182] 10/23/2015  . Chest pain [R07.9] 06/16/2013  . Hypertension [I10] 06/16/2013  . Panic anxiety syndrome [F41.0] 06/16/2013  . Obesity [E66.9] 06/16/2013  . Headache(784.0) [R51] 06/16/2013  . Paresthesia [R20.2] 06/16/2013    Total Time spent with patient: 30 minutes  Subjective:   James Hernandez is a 45 y.o. male patient admitted with complaints of chest pain and increased drug abuse.   HPI:    James Hernandez is a 45 year old male who presented voluntarily to the [facility] reporting chest pain and trouble sleeping for several days. He denies any previous psychiatric history. Patient reports struggling with depressive symptoms such as hopelessness, depressed mood, decreased motivation, and passive suicidal ideation without a plan for several years. Patient reports feeling unable to cope with feelings related to his marriage and saw using drugs as an option. James Hernandez reports he was ashamed to admit this because "That's not normally how I cope. That is not in my character." He relates that problems in his marriage began many years ago when his wife informed him that he was not the biological father of his oldest son. James Hernandez reports that the marriage therapist he was seeing at the time encouraged him to move on but it still continues to bother him greatly. He denies any active intent to harm himself or others. Denies any psychotic symptoms. The patient denies any clear history of past manic episodes reporting his current insomnia is related to the  cocaine use. On admission his urine drug screen is positive for cocaine and marijuana. Patient denies any history of suicide attempt. He has never had any regular outpatient follow up aside from a short series of sessions at the [facility] at his workplace last year. The patient plans to stay somewhere else away from his wife temporarily until he can be established with outpatient services. He reports feeling stable to discharge from the ED today with the resources. Discussed with patient how substances such as cocaine carry the risk of both physical complications such as heart attack but also can worsen depressive symptoms. Kimber stated "I can stay away from the drugs when I leave here." The patient has been medically cleared. Medically the patient reports a history of Hypertension for which he takes norvasc. There is no indication that the patient is experiencing any signs or symptoms of withdrawal.   Past Psychiatric History: Patient denies   Risk to Self: Suicidal Ideation: No Suicidal Intent: No Is patient at risk for suicide?: No Suicidal Plan?: No Access to Means: No What has been your use of drugs/alcohol within the last 12 months?: Cocaine, Marijuana, Heroin Intentional Self Injurious Behavior: None Risk to Others: Homicidal Ideation: No Thoughts of Harm to Others: No Current Homicidal Intent: No Current Homicidal Plan: No Access to Homicidal Means: No History of harm to others?: No Assessment of Violence: None Noted Does patient have access to weapons?: No Criminal Charges Pending?: No Does patient have a court date: No Prior Inpatient Therapy: Prior Inpatient Therapy: No Prior Outpatient Therapy: Prior Outpatient Therapy: No Does patient have an ACCT team?: No Does patient have Intensive In-House Services?  :  No Does patient have [facility] services? : No Does patient have [facility] services?: No  Past Medical History:  Past Medical History  Diagnosis Date  . Hypertension   .  Headache(784.0)     "frequently; not qd" (06/16/2013)    Past Surgical History  Procedure Laterality Date  . No past surgeries     Family History: History reviewed. No pertinent family history. Family Psychiatric  History: Reports his sister is being treated for Bipolar Disorder. Social History:  History  Alcohol Use  . Yes     History  Drug Use  . Yes  . Special: Cocaine, Marijuana    Social History   Social History  . Marital Status: Married    Spouse Name: N/A  . Number of Children: N/A  . Years of Education: N/A   Social History Main Topics  . Smoking status: Never Smoker   . Smokeless tobacco: Never Used  . Alcohol Use: Yes  . Drug Use: Yes    Special: Cocaine, Marijuana  . Sexual Activity: Yes   Other Topics Concern  . None   Social History Narrative   Additional Social History:    Allergies:  No Known Allergies  Labs:  Results for orders placed or performed during the hospital encounter of 10/22/15 (from the past 48 hour(s))  Comprehensive metabolic panel     Status: Abnormal   Collection Time: 10/22/15  1:00 PM  Result Value Ref Range   Sodium 141 135 - 145 mmol/L   Potassium 3.5 3.5 - 5.1 mmol/L   Chloride 104 101 - 111 mmol/L   CO2 28 22 - 32 mmol/L   Glucose, Bld 128 (H) 65 - 99 mg/dL   BUN 7 6 - 20 mg/dL   Creatinine, Ser 1.40 (H) 0.61 - 1.24 mg/dL   Calcium 9.9 8.9 - 10.3 mg/dL   Total Protein 8.0 6.5 - 8.1 g/dL   Albumin 4.4 3.5 - 5.0 g/dL   AST 26 15 - 41 U/L   ALT 21 17 - 63 U/L   Alkaline Phosphatase 51 38 - 126 U/L   Total Bilirubin 1.3 (H) 0.3 - 1.2 mg/dL   GFR calc non Af Amer 60 (L) >60 mL/min   GFR calc Af Amer >60 >60 mL/min    Comment: (NOTE) The eGFR has been calculated using the CKD EPI equation. This calculation has not been validated in all clinical situations. eGFR's persistently <60 mL/min signify possible Chronic Kidney Disease.    Anion gap 9 5 - 15  cbc     Status: Abnormal   Collection Time: 10/22/15  1:00 PM   Result Value Ref Range   WBC 8.3 4.0 - 10.5 K/uL   RBC 5.89 (H) 4.22 - 5.81 MIL/uL   Hemoglobin 16.8 13.0 - 17.0 g/dL   HCT 50.5 39.0 - 52.0 %   MCV 85.7 78.0 - 100.0 fL   MCH 28.5 26.0 - 34.0 pg   MCHC 33.3 30.0 - 36.0 g/dL   RDW 14.1 11.5 - 15.5 %   Platelets 232 150 - 400 K/uL  I-Stat Troponin, ED (not at [facility])     Status: None   Collection Time: 10/22/15  1:54 PM  Result Value Ref Range   Troponin i, poc 0.00 0.00 - 0.08 ng/mL   Comment 3            Comment: Due to the release kinetics of cTnI, a negative result within the first hours of the onset of symptoms does not rule  out myocardial infarction with certainty. If myocardial infarction is still suspected, repeat the test at appropriate intervals.   Rapid urine drug screen (hospital performed)     Status: Abnormal   Collection Time: 10/22/15  6:58 PM  Result Value Ref Range   Opiates NONE DETECTED NONE DETECTED   Cocaine POSITIVE (A) NONE DETECTED   Benzodiazepines NONE DETECTED NONE DETECTED   Amphetamines NONE DETECTED NONE DETECTED   Tetrahydrocannabinol POSITIVE (A) NONE DETECTED   Barbiturates NONE DETECTED NONE DETECTED    Comment:        DRUG SCREEN FOR MEDICAL PURPOSES ONLY.  IF CONFIRMATION IS NEEDED FOR ANY PURPOSE, NOTIFY LAB WITHIN 5 DAYS.        LOWEST DETECTABLE LIMITS FOR URINE DRUG SCREEN Drug Class       Cutoff (ng/mL) Amphetamine      1000 Barbiturate      200 Benzodiazepine   450 Tricyclics       388 Opiates          300 Cocaine          300 THC              50   Troponin I     Status: None   Collection Time: 10/22/15  8:27 PM  Result Value Ref Range   Troponin I 0.03 <0.031 ng/mL    Comment:        NO INDICATION OF MYOCARDIAL INJURY.   Troponin I     Status: None   Collection Time: 10/23/15  3:44 AM  Result Value Ref Range   Troponin I <0.03 <0.031 ng/mL    Comment:        NO INDICATION OF MYOCARDIAL INJURY.     Current Facility-Administered Medications  Medication Dose  Route Frequency Provider Last Rate Last Dose  . acetaminophen (TYLENOL) tablet 650 mg  650 mg Oral Q4H PRN Noemi Chapel, MD      . ibuprofen (ADVIL,MOTRIN) tablet 600 mg  600 mg Oral Q8H PRN Noemi Chapel, MD      . LORazepam (ATIVAN) tablet 1 mg  1 mg Oral Q8H PRN Noemi Chapel, MD      . ondansetron [facility]) tablet 4 mg  4 mg Oral Q8H PRN Noemi Chapel, MD      . zolpidem (AMBIEN) tablet 5 mg  5 mg Oral QHS PRN Noemi Chapel, MD       Current Outpatient Prescriptions  Medication Sig Dispense Refill  . amLODipine (NORVASC) 5 MG tablet Take 5 mg by mouth daily.    Marland Kitchen ibuprofen (ADVIL,MOTRIN) 200 MG tablet Take 400 mg by mouth daily as needed for moderate pain.      Musculoskeletal:  Unable to assess via camera   Psychiatric Specialty Exam: Review of Systems  Psychiatric/Behavioral: Positive for depression and substance abuse. Negative for suicidal ideas, hallucinations and memory loss. The patient is nervous/anxious and has insomnia.     Blood pressure 149/104, pulse 63, temperature 98.9 F (37.2 C), temperature source Oral, resp. rate 14, SpO2 100 %.There is no weight on file to calculate BMI.  General Appearance: Casual  Eye Contact::  Good  Speech:  Clear and Coherent  Volume:  Normal  Mood:  Anxious  Affect:  Congruent  Thought Process:  Goal Directed and Intact  Orientation:  Full (Time, Place, and Person)  Thought Content:  Symptoms, worries, concerns   Suicidal Thoughts:  No  Homicidal Thoughts:  No  Memory:  Immediate;   Good Recent;  Good Remote;   Good  Judgement:  Fair  Insight:  Present  Psychomotor Activity:  Normal  Concentration:  Good  Recall:  Good  Fund of Knowledge:Good  Language: Good  Akathisia:  No  Handed:  Right  AIMS (if indicated):     Assets:  Communication Skills Desire for Improvement Financial Resources/Insurance Housing Intimacy Leisure Time Physical Health Resilience Social Support Talents/Skills  ADL's:  Intact  Cognition: WNL   Sleep:      Treatment Plan Summary:  Patient appears motivated to follow up outpatient for individual psychotherapy to address marital stressors and for Psychiatrist for medication management. He is denying any active suicidal ideation at this time.   Disposition: No evidence of imminent risk to self or others at present.   Patient does not meet criteria for psychiatric inpatient admission. Supportive therapy provided about ongoing stressors. Discussed crisis plan, support from social network, calling 911, coming to the Emergency Department, and calling Suicide Hotline.  Elmarie Shiley, NP 10/23/2015 10:10 AM
